# Patient Record
Sex: Female | Born: 1960 | Race: Black or African American | Hispanic: No | Marital: Married | Smoking: Never smoker
Health system: Southern US, Community
[De-identification: ages and names within clinical notes are randomized; demographics above are authoritative.]

## PROBLEM LIST (undated history)

## (undated) DIAGNOSIS — Z803 Family history of malignant neoplasm of breast: Principal | ICD-10-CM

## (undated) DIAGNOSIS — K219 Gastro-esophageal reflux disease without esophagitis: Secondary | ICD-10-CM

## (undated) DIAGNOSIS — E669 Obesity, unspecified: Secondary | ICD-10-CM

## (undated) DIAGNOSIS — Z800 Family history of malignant neoplasm of digestive organs: Secondary | ICD-10-CM

## (undated) DIAGNOSIS — D649 Anemia, unspecified: Secondary | ICD-10-CM

## (undated) DIAGNOSIS — M1990 Unspecified osteoarthritis, unspecified site: Secondary | ICD-10-CM

## (undated) DIAGNOSIS — Z9989 Dependence on other enabling machines and devices: Secondary | ICD-10-CM

## (undated) DIAGNOSIS — C50411 Malignant neoplasm of upper-outer quadrant of right female breast: Secondary | ICD-10-CM

## (undated) DIAGNOSIS — Z171 Estrogen receptor negative status [ER-]: Secondary | ICD-10-CM

## (undated) DIAGNOSIS — I10 Essential (primary) hypertension: Secondary | ICD-10-CM

## (undated) DIAGNOSIS — R011 Cardiac murmur, unspecified: Secondary | ICD-10-CM

## (undated) DIAGNOSIS — Z923 Personal history of irradiation: Secondary | ICD-10-CM

## (undated) DIAGNOSIS — G4733 Obstructive sleep apnea (adult) (pediatric): Secondary | ICD-10-CM

## (undated) HISTORY — DX: Estrogen receptor negative status (ER-): Z17.1

## (undated) HISTORY — DX: Family history of malignant neoplasm of digestive organs: Z80.0

## (undated) HISTORY — PX: BUNIONECTOMY: SHX129

## (undated) HISTORY — PX: LAPAROSCOPIC CHOLECYSTECTOMY: SUR755

## (undated) HISTORY — DX: Malignant neoplasm of upper-outer quadrant of right female breast: C50.411

## (undated) HISTORY — PX: BREAST BIOPSY: SHX20

## (undated) HISTORY — PX: TUBAL LIGATION: SHX77

## (undated) HISTORY — PX: ABDOMINAL HYSTERECTOMY: SHX81

## (undated) HISTORY — DX: Family history of malignant neoplasm of breast: Z80.3

---

## 1981-01-31 HISTORY — PX: DILATION AND CURETTAGE OF UTERUS: SHX78

## 2011-11-26 ENCOUNTER — Emergency Department (HOSPITAL_BASED_OUTPATIENT_CLINIC_OR_DEPARTMENT_OTHER): Payer: BC Managed Care – PPO

## 2011-11-26 ENCOUNTER — Emergency Department (HOSPITAL_BASED_OUTPATIENT_CLINIC_OR_DEPARTMENT_OTHER)
Admission: EM | Admit: 2011-11-26 | Discharge: 2011-11-26 | Disposition: A | Discharge status: Home / Self Care | Payer: BC Managed Care – PPO | Attending: Emergency Medicine | Admitting: Emergency Medicine

## 2011-11-26 ENCOUNTER — Encounter (HOSPITAL_BASED_OUTPATIENT_CLINIC_OR_DEPARTMENT_OTHER): Payer: Self-pay | Admitting: Student

## 2011-11-26 DIAGNOSIS — Z8739 Personal history of other diseases of the musculoskeletal system and connective tissue: Secondary | ICD-10-CM | POA: Insufficient documentation

## 2011-11-26 DIAGNOSIS — M79609 Pain in unspecified limb: Secondary | ICD-10-CM | POA: Insufficient documentation

## 2011-11-26 DIAGNOSIS — E669 Obesity, unspecified: Secondary | ICD-10-CM | POA: Insufficient documentation

## 2011-11-26 DIAGNOSIS — I10 Essential (primary) hypertension: Secondary | ICD-10-CM | POA: Insufficient documentation

## 2011-11-26 DIAGNOSIS — M79606 Pain in leg, unspecified: Secondary | ICD-10-CM

## 2011-11-26 HISTORY — DX: Obesity, unspecified: E66.9

## 2011-11-26 HISTORY — DX: Essential (primary) hypertension: I10

## 2011-11-26 HISTORY — DX: Unspecified osteoarthritis, unspecified site: M19.90

## 2011-11-26 MED ORDER — HYDROCODONE-ACETAMINOPHEN 5-325 MG PO TABS: 1.0000 | ORAL_TABLET | ORAL | Status: DC | PRN

## 2011-11-26 MED ORDER — IBUPROFEN 800 MG PO TABS: 800.0000 mg | ORAL_TABLET | Freq: Three times a day (TID) | ORAL | Status: DC

## 2011-11-26 NOTE — ED Notes (Signed)
Pt just returned from doppler, ambulatory

## 2011-11-26 NOTE — ED Provider Notes (Signed)
History     CSN: [id number]  Arrival date & time 11/26/11  1801   First MD Initiated Contact with Patient 11/26/11 1915      Chief Complaint  Patient presents with  . Leg Pain    right leg    (Consider location/radiation/quality/duration/timing/severity/associated sxs/prior treatment) Patient is a 51 y.o. female presenting with leg pain. The history is provided by the patient.  Leg Pain  The incident occurred more than 1 week ago. There was no injury mechanism. The pain is present in the right leg. The pain is moderate. Pertinent negatives include no numbness. Associated symptoms comments: Right lower extremity pain for 2-3 weeks that is present with walking better with rest. Pain is predominantly in posterior leg, including calf. She does not know whether or not there has been swelling. No discoloration. No fever or known injury. No back pain. She denies SOB or chest pain. .    Past Medical History  Diagnosis Date  . Arthritis   . Hypertension   . Obesity     Past Surgical History  Procedure Date  . Abdominal hysterectomy   . Cholecystectomy     History reviewed. No pertinent family history.  History  Substance Use Topics  . Smoking status: Never Smoker   . Smokeless tobacco: Not on file  . Alcohol Use: No    OB History    Grav Para Term Preterm Abortions TAB SAB Ect Mult Living                  Review of Systems  Respiratory: Negative for shortness of breath.   Cardiovascular: Negative for chest pain.  Musculoskeletal:       See HPI.  Neurological: Negative for weakness and numbness.    Allergies  Review of patient's allergies indicates no known allergies.  Home Medications  No current outpatient prescriptions on file.  BP 193/94  Pulse 81  Temp 98.2 F (36.8 C) (Oral)  Resp 22  Wt 370 lb (167.831 kg)  SpO2 100%  Physical Exam  Constitutional: She is oriented to person, place, and time. She appears well-developed and well-nourished. No  distress.  Cardiovascular:       Distal pulses in LE's present and equal.  Pulmonary/Chest: Effort normal.  Musculoskeletal:       Right leg without swelling or discoloration. No masses. Tender to palpation posterior thigh and calf. FROM lower extremities. Ambulatory with full weight bearing ability.  Neurological: She is alert and oriented to person, place, and time.  Skin: Skin is warm and dry. No erythema.    ED Course  Procedures (including critical care time)  Labs Reviewed - No data to display No results found. US Venous Img Lower Unilateral Right  11/26/2011  *RADIOLOGY REPORT*  Clinical Data: Leg pain  RIGHT LOWER EXTREMITY VENOUS DUPLEX ULTRASOUND  Technique:  Gray-scale sonography with graded compression, as well as color Doppler and duplex ultrasound, were performed to evaluate the deep venous system of the lower extremity from the level of the common femoral vein through the popliteal and proximal calf veins. Spectral Doppler was utilized to evaluate flow at rest and with distal augmentation maneuvers.  Comparison:  None.  Findings: The visualized right lower extremity deep venous system appears patent, noting that the mid/distal SFV is poorly visualized due to body habitus.  Normal compressibility.  Patent color Doppler flow.  Satisfactory spectral Doppler with respiratory variation and response to augmentation.  The greater saphenous vein, where visualized, is patent and compressible.  IMPRESSION: No deep venous thrombosis in the visualized right lower extremity.   Original Report Authenticated By: Charline Bills, M.D.     No diagnosis found. 1. Lower extremity pain    MDM  Doppler study negative for DVT. DDx: muscular leg pain vs. Radiculopathy. Pain medication and PCP follow up.        Rodena Medin, PA-C 11/26/11 2237

## 2011-11-26 NOTE — ED Provider Notes (Signed)
Medical screening examination/treatment/procedure(s) were performed by non-physician practitioner and as supervising physician I was immediately available for consultation/collaboration.   Delton Stelle, MD 11/26/11 2304 

## 2011-11-26 NOTE — ED Notes (Signed)
Right leg pain and tingling in calf and upper leg, reports cramping with exercise.

## 2012-07-17 ENCOUNTER — Institutional Professional Consult (permissible substitution): Payer: BC Managed Care – PPO | Admitting: Pulmonary Disease

## 2013-05-03 ENCOUNTER — Emergency Department (HOSPITAL_BASED_OUTPATIENT_CLINIC_OR_DEPARTMENT_OTHER)
Admission: EM | Admit: 2013-05-03 | Discharge: 2013-05-03 | Disposition: A | Discharge status: Home / Self Care | Payer: BC Managed Care – PPO | Attending: Emergency Medicine | Admitting: Emergency Medicine

## 2013-05-03 ENCOUNTER — Emergency Department (HOSPITAL_BASED_OUTPATIENT_CLINIC_OR_DEPARTMENT_OTHER): Payer: BC Managed Care – PPO

## 2013-05-03 ENCOUNTER — Encounter (HOSPITAL_BASED_OUTPATIENT_CLINIC_OR_DEPARTMENT_OTHER): Payer: Self-pay | Admitting: Emergency Medicine

## 2013-05-03 DIAGNOSIS — I10 Essential (primary) hypertension: Secondary | ICD-10-CM | POA: Insufficient documentation

## 2013-05-03 DIAGNOSIS — IMO0001 Reserved for inherently not codable concepts without codable children: Secondary | ICD-10-CM | POA: Insufficient documentation

## 2013-05-03 DIAGNOSIS — M129 Arthropathy, unspecified: Secondary | ICD-10-CM | POA: Insufficient documentation

## 2013-05-03 DIAGNOSIS — Z791 Long term (current) use of non-steroidal anti-inflammatories (NSAID): Secondary | ICD-10-CM | POA: Insufficient documentation

## 2013-05-03 DIAGNOSIS — M25511 Pain in right shoulder: Secondary | ICD-10-CM

## 2013-05-03 DIAGNOSIS — M25519 Pain in unspecified shoulder: Secondary | ICD-10-CM | POA: Insufficient documentation

## 2013-05-03 DIAGNOSIS — E669 Obesity, unspecified: Secondary | ICD-10-CM | POA: Insufficient documentation

## 2013-05-03 DIAGNOSIS — Z79899 Other long term (current) drug therapy: Secondary | ICD-10-CM | POA: Insufficient documentation

## 2013-05-03 MED ORDER — HYDROCODONE-ACETAMINOPHEN 5-325 MG PO TABS: 1.0000 | ORAL_TABLET | Freq: Four times a day (QID) | ORAL | Status: DC | PRN

## 2013-05-03 NOTE — ED Notes (Signed)
Arm sling applied, pt tolerated well, +PMS post sling application.

## 2013-05-03 NOTE — ED Provider Notes (Signed)
CSN: [id number]     Arrival date & time 05/03/13  1920 History  This chart was scribed for Mervin Kung, MD by Maree Erie, ED Scribe. The patient was seen in room MH10/MH10. Patient's care was started at 9:25 PM.     Chief Complaint  Patient presents with  . Arm Pain     Patient is a 53 y.o. female presenting with arm pain. The history is provided by the patient. No language interpreter was used.  Arm Pain The current episode started more than 2 days ago. The problem occurs daily. The problem has not changed since onset.Pertinent negatives include no chest pain, no abdominal pain, no headaches and no shortness of breath.    HPI Comments: Jodi Mccormick is a 53 y.o. female who presents to the Emergency Department complaining of intermittent upper right arm pain that began four days ago. She describes the pain as throbbing and sharp and rates it as a 10/10 in severity currently. The pain is worsened by going across the chest and raising the arm straight up. She has been taking 800 mg ibuprofen and Etodolac without relief. She denies any injury and states she woke up noticing the pain. She denies decreased ROM secondary to pain. She denies prior similar pain to the same area. She denies neck pain, back pain, fever, chills, visual changes, cough, rhinorrhea, sore throat, chest pain, SOB, abdominal pain, nausea, vomiting, diarrhea, dysuria, leg swelling, rash, headache, numbness or weakness in the extremity or history of bleeding easily.    Pt denies having a PCP currently    Past Medical History  Diagnosis Date  . Arthritis   . Hypertension   . Obesity    Past Surgical History  Procedure Laterality Date  . Abdominal hysterectomy    . Cholecystectomy    . Bunionectomy     No family history on file. History  Substance Use Topics  . Smoking status: Never Smoker   . Smokeless tobacco: Not on file  . Alcohol Use: No   OB History   Grav Para Term Preterm Abortions TAB SAB  Ect Mult Living                 Review of Systems  Constitutional: Negative for fever and chills.  HENT: Negative for rhinorrhea.   Eyes: Negative for visual disturbance.  Respiratory: Negative for cough and shortness of breath.   Cardiovascular: Negative for chest pain and leg swelling.  Gastrointestinal: Negative for nausea, vomiting, abdominal pain and diarrhea.  Genitourinary: Negative for dysuria.  Musculoskeletal: Positive for myalgias. Negative for back pain and neck pain.  Skin: Negative for rash.  Neurological: Negative for headaches.  Hematological: Does not bruise/bleed easily.  Psychiatric/Behavioral: Negative for confusion.      Allergies  Review of patient's allergies indicates no known allergies.  Home Medications   Current Outpatient Rx  Name  Route  Sig  Dispense  Refill  . AMLODIPINE BESYLATE PO   Oral   Take by mouth.         . ETODOLAC PO   Oral   Take by mouth.         Marland Kitchen LOSARTAN POTASSIUM PO   Oral   Take by mouth.         Marland Kitchen HYDROcodone-acetaminophen (NORCO/VICODIN) 5-325 MG per tablet   Oral   Take 1 tablet by mouth every 4 (four) hours as needed for pain.   15 tablet   0   . HYDROcodone-acetaminophen (NORCO/VICODIN) 5-325 MG  per tablet   Oral   Take 1-2 tablets by mouth every 6 (six) hours as needed for moderate pain.   20 tablet   0   . ibuprofen (ADVIL,MOTRIN) 800 MG tablet   Oral   Take 1 tablet (800 mg total) by mouth 3 (three) times daily.   21 tablet   0    Triage Vitals: BP 156/73  Pulse 73  Temp(Src) 98.9 F (37.2 C) (Oral)  Resp 18  Ht 5\' 6"  (1.676 m)  Wt 367 lb (166.47 kg)  BMI 59.26 kg/m2  SpO2 97%  Physical Exam  Nursing note and vitals reviewed. Constitutional: She is oriented to person, place, and time. She appears well-developed and well-nourished.  Eyes: EOM are normal.  Cardiovascular: Normal rate and regular rhythm.   Pulses:      Radial pulses are 2+ on the right side.  Pulmonary/Chest: Effort  normal and breath sounds normal. No respiratory distress. She has no wheezes. She has no rales.  Abdominal: Bowel sounds are normal. There is no tenderness.  Musculoskeletal: She exhibits no edema.  Elbow and fingers on right move well.  Neurological: She is alert and oriented to person, place, and time.  Skin: Skin is warm and dry.  Cap refill 1 second.    ED Course  Procedures (including critical care time)  DIAGNOSTIC STUDIES: Oxygen Saturation is 97% on room air, adequate by my interpretation.    COORDINATION OF CARE: 9:40 PM -Will order right shoulder x-ray. Patient verbalizes understanding and agrees with treatment plan.  Medications - No data to display   Dg Shoulder Right  05/03/2013   CLINICAL DATA:  Right upper arm pain.  EXAM: RIGHT SHOULDER - 2+ VIEW  COMPARISON:  None.  FINDINGS: There is no evidence of fracture or dislocation. The right humeral head is seated within the glenoid fossa. The acromioclavicular joint is unremarkable in appearance. No significant soft tissue abnormalities are seen. The visualized portions of the right lung are clear.  IMPRESSION: No evidence of fracture or dislocation.   Electronically Signed   By: Garald Balding M.D.   On: 05/03/2013 22:46     EKG Interpretation None      MDM   Final diagnoses:  Shoulder pain, right    X-rays negative. Clinically suspect this may be an impingement syndrome. Doesn't seem to fit rotator cuff injury. Will refer patient to sports medicine treat with a sling and pain medication. X-rays negative for any bony injury.  I personally performed the services described in this documentation, which was scribed in my presence. The recorded information has been reviewed and is accurate.     Mervin Kung, MD 05/03/13 [phone]

## 2013-05-03 NOTE — ED Notes (Signed)
Patient transported to X-ray via stretcher per tech. 

## 2013-05-03 NOTE — ED Notes (Addendum)
C/o intermittent pain to entire right arm x 2 weeks-denies injury-pain worse when moves to reach

## 2013-05-03 NOTE — Discharge Instructions (Signed)
Wear the shoulder immobilizer sling as much as possible. Call sports medicine for followup referral information provided. Take pain medication as needed. Return for any new or worse symptoms. Suspect that you may have a shoulder impingement syndrome. Does not seem to be consistent with rotator cuff injury.

## 2013-05-03 NOTE — ED Notes (Signed)
MD at bedside. 

## 2013-05-07 ENCOUNTER — Ambulatory Visit (INDEPENDENT_AMBULATORY_CARE_PROVIDER_SITE_OTHER): Payer: BC Managed Care – PPO | Admitting: Family Medicine

## 2013-05-07 ENCOUNTER — Encounter: Payer: Self-pay | Admitting: Family Medicine

## 2013-05-07 VITALS — BP 125/80 | HR 60 | Ht 66.0 in | Wt 365.0 lb

## 2013-05-07 DIAGNOSIS — M25519 Pain in unspecified shoulder: Secondary | ICD-10-CM

## 2013-05-07 DIAGNOSIS — M25511 Pain in right shoulder: Secondary | ICD-10-CM

## 2013-05-07 NOTE — Patient Instructions (Signed)
You have rotator cuff impingement Try to avoid painful activities (overhead activities, lifting with extended arm) as much as possible. Aleve 2 tabs twice a day with food OR ibuprofen 3 tabs three times a day with food for pain and inflammation. Can take tylenol in addition to this. Subacromial injection may be beneficial to help with pain and to decrease inflammation - you were given this today. Consider physical therapy with transition to home exercise program. In about 5 days start the home exercise program with theraband and scapular stabilization exercises daily 3 sets of 10 - these are very important for long term relief even if an injection was given. If not improving at follow-up we will consider further imaging, physical therapy and/or nitro patches. Follow up with me in 1 month to 6 weeks.

## 2013-05-09 ENCOUNTER — Encounter: Payer: Self-pay | Admitting: Family Medicine

## 2013-05-09 DIAGNOSIS — M25511 Pain in right shoulder: Secondary | ICD-10-CM | POA: Insufficient documentation

## 2013-05-09 NOTE — Progress Notes (Signed)
Patient ID: Jodi Mccormick, female   DOB: 1960-04-21, 53 y.o.   MRN: [id number]  PCP: No PCP Per Patient  Subjective:   HPI: Patient is a 53 y.o. female here for right arm pain.  Patient denies known injury or trauma. States about 1 week ago began having pain in right shoulder/upper arm. Pain worse with certain movements - overhead, reaching, reaching behind. Right handed. Has had bursitis of this shoulder before. Ibuprofen helped initially but not now.  Past Medical History  Diagnosis Date  . Arthritis   . Hypertension   . Obesity     Current Outpatient Prescriptions on File Prior to Visit  Medication Sig Dispense Refill  . AMLODIPINE BESYLATE PO Take by mouth.      Marland Kitchen LOSARTAN POTASSIUM PO Take by mouth.      . ETODOLAC PO Take by mouth.      Marland Kitchen HYDROcodone-acetaminophen (NORCO/VICODIN) 5-325 MG per tablet Take 1 tablet by mouth every 4 (four) hours as needed for pain.  15 tablet  0  . HYDROcodone-acetaminophen (NORCO/VICODIN) 5-325 MG per tablet Take 1-2 tablets by mouth every 6 (six) hours as needed for moderate pain.  20 tablet  0  . ibuprofen (ADVIL,MOTRIN) 800 MG tablet Take 1 tablet (800 mg total) by mouth 3 (three) times daily.  21 tablet  0   No current facility-administered medications on file prior to visit.    Past Surgical History  Procedure Laterality Date  . Abdominal hysterectomy    . Cholecystectomy    . Bunionectomy      No Known Allergies  History   Social History  . Marital Status: Married    Spouse Name: N/A    Number of Children: N/A  . Years of Education: N/A   Occupational History  . Not on file.   Social History Main Topics  . Smoking status: Never Smoker   . Smokeless tobacco: Not on file  . Alcohol Use: No  . Drug Use: No  . Sexual Activity: Not on file   Other Topics Concern  . Not on file   Social History Narrative  . No narrative on file    Family History  Problem Relation Age of Onset  . Hypertension Mother   .  Hypertension Father   . Diabetes Father     BP 125/80  Pulse 60  Ht 5\' 6"  (1.676 m)  Wt 365 lb (165.563 kg)  BMI 58.94 kg/m2  Review of Systems: See HPI above.    Objective:  Physical Exam:  Gen: NAD  Right shoulder: No swelling, ecchymoses.  No gross deformity. Mild anterior tenderness. FROM with painful arc. Positive Hawkins, Neers. Negative Speeds, Yergasons. Strength 5/5 with empty can and resisted internal/external rotation. Pain empty can > ER. NV intact distally.    Assessment & Plan:  1. Right shoulder pain - 2/2 rotator cuff impingement.  Discussed options - She would like to start with HEP which was demonstrated today.  Subacromial injection given as well though technically difficult due to habitus.  NSAIDs as needed, avoid painful activities (overhead, reaching).  Consider physical therapy, further imaging, nitro patches if not improving.  After informed written consent, patient was seated on exam table. Right shoulder was prepped with alcohol swab and utilizing posterior approach, patient's right subacromial space was injected with 3:1 marcaine: depomedrol. Patient tolerated the procedure well without immediate complications.

## 2013-05-09 NOTE — Assessment & Plan Note (Signed)
2/2 rotator cuff impingement.  Discussed options - She would like to start with HEP which was demonstrated today.  Subacromial injection given as well though technically difficult due to habitus.  NSAIDs as needed, avoid painful activities (overhead, reaching).  Consider physical therapy, further imaging, nitro patches if not improving.  After informed written consent, patient was seated on exam table. Right shoulder was prepped with alcohol swab and utilizing posterior approach, patient's right subacromial space was injected with 3:1 marcaine: depomedrol. Patient tolerated the procedure well without immediate complications.

## 2013-06-04 ENCOUNTER — Encounter: Payer: Self-pay | Admitting: Family Medicine

## 2013-06-04 ENCOUNTER — Ambulatory Visit (INDEPENDENT_AMBULATORY_CARE_PROVIDER_SITE_OTHER): Payer: BC Managed Care – PPO | Admitting: Family Medicine

## 2013-06-04 VITALS — BP 114/73 | HR 58 | Ht 66.0 in | Wt 367.0 lb

## 2013-06-04 DIAGNOSIS — M25519 Pain in unspecified shoulder: Secondary | ICD-10-CM

## 2013-06-04 DIAGNOSIS — M25511 Pain in right shoulder: Secondary | ICD-10-CM

## 2013-06-04 MED ORDER — NITROGLYCERIN 0.2 MG/HR TD PT24: MEDICATED_PATCH | TRANSDERMAL | Status: DC

## 2013-06-04 NOTE — Patient Instructions (Signed)
You have rotator cuff impingement Start physical therapy and do home exercises on days you don't go to therapy. Add nitro patches - cut in to 1/4ths and put one of these over the affected shoulder, change every day. Try to avoid painful activities (overhead activities, lifting with extended arm) as much as possible. Aleve 2 tabs twice a day with food OR ibuprofen 3 tabs three times a day with food for pain and inflammation. Can take tylenol in addition to this. If not improving at follow-up we will consider MRI. Follow up with me in 6 weeks.

## 2013-06-05 ENCOUNTER — Encounter: Payer: Self-pay | Admitting: Family Medicine

## 2013-06-05 NOTE — Progress Notes (Addendum)
Patient ID: Jodi Mccormick, female   DOB: 1960-11-13, 53 y.o.   MRN: [id number]  PCP: No PCP Per Patient  Subjective:   HPI: Patient is a 53 y.o. female here for right arm pain.  4/7: Patient denies known injury or trauma. States about 1 week ago began having pain in right shoulder/upper arm. Pain worse with certain movements - overhead, reaching, reaching behind. Right handed. Has had bursitis of this shoulder before. Ibuprofen helped initially but not now.  5/5: Patient reports injection helped some but still has 7/10 level of pain - much better with certain movements, doesn't get to 10/10. Lying on this not bad - worse in morning though. Doing HEP some.  Past Medical History  Diagnosis Date  . Arthritis   . Hypertension   . Obesity     Current Outpatient Prescriptions on File Prior to Visit  Medication Sig Dispense Refill  . AMLODIPINE BESYLATE PO Take by mouth.      . ETODOLAC PO Take by mouth.      Marland Kitchen HYDROcodone-acetaminophen (NORCO/VICODIN) 5-325 MG per tablet Take 1 tablet by mouth every 4 (four) hours as needed for pain.  15 tablet  0  . HYDROcodone-acetaminophen (NORCO/VICODIN) 5-325 MG per tablet Take 1-2 tablets by mouth every 6 (six) hours as needed for moderate pain.  20 tablet  0  . ibuprofen (ADVIL,MOTRIN) 800 MG tablet Take 1 tablet (800 mg total) by mouth 3 (three) times daily.  21 tablet  0  . LOSARTAN POTASSIUM PO Take by mouth.       No current facility-administered medications on file prior to visit.    Past Surgical History  Procedure Laterality Date  . Abdominal hysterectomy    . Cholecystectomy    . Bunionectomy      No Known Allergies  History   Social History  . Marital Status: Married    Spouse Name: N/A    Number of Children: N/A  . Years of Education: N/A   Occupational History  . Not on file.   Social History Main Topics  . Smoking status: Never Smoker   . Smokeless tobacco: Not on file  . Alcohol Use: No  . Drug Use: No   . Sexual Activity: Not on file   Other Topics Concern  . Not on file   Social History Narrative  . No narrative on file    Family History  Problem Relation Age of Onset  . Hypertension Mother   . Hypertension Father   . Diabetes Father     BP 114/73  Pulse 58  Ht 5\' 6"  (1.676 m)  Wt 367 lb (166.47 kg)  BMI 59.26 kg/m2  Review of Systems: See HPI above.    Objective:  Physical Exam:  Gen: NAD  Right shoulder: No swelling, ecchymoses.  No gross deformity. Mild anterior tenderness. FROM with painful arc. Positive Hawkins, Neers. Negative Speeds, Yergasons. Strength 5/5 with empty can and resisted internal/external rotation. Pain empty can > ER. NV intact distally.    Assessment & Plan:  1. Right shoulder pain - 2/2 rotator cuff impingement.  Injection helped her a little symptomatically but not significantly.  Will start physical therapy and nitro patches.  F/u in 6 weeks.  Consider MRI if not improving.  Addendum:  MRI reviewed and discussed with patient.  She has evidence of tendinopathy, bursitis as well as a very small insertional supraspinatus tear.  She would like to continue with current treatment instead of seeing a surgeon at this  time.  Will continue nitro patches out to 3 months.  Continue HEP.  Call to follow up at that time or if she would like to see orthopedic surgeon.

## 2013-06-05 NOTE — Assessment & Plan Note (Signed)
2/2 rotator cuff impingement.  Injection helped her a little symptomatically but not significantly.  Will start physical therapy and nitro patches.  F/u in 6 weeks.  Consider MRI if not improving.

## 2013-06-11 ENCOUNTER — Ambulatory Visit: Payer: BC Managed Care – PPO | Admitting: Family Medicine

## 2013-06-11 ENCOUNTER — Ambulatory Visit: Payer: BC Managed Care – PPO | Admitting: Physical Therapy

## 2013-07-01 ENCOUNTER — Telehealth: Payer: Self-pay | Admitting: Family Medicine

## 2013-07-01 DIAGNOSIS — M25511 Pain in right shoulder: Secondary | ICD-10-CM

## 2013-07-01 NOTE — Telephone Encounter (Signed)
Patient reports she is unfortunately not improving despite injection, physical therapy, home exercises, and nitro patches.  We will move forward with MRI to assess for right rotator cuff tear.

## 2013-07-01 NOTE — Telephone Encounter (Signed)
See other phone note

## 2013-07-13 ENCOUNTER — Ambulatory Visit (HOSPITAL_BASED_OUTPATIENT_CLINIC_OR_DEPARTMENT_OTHER)
Admission: RE | Admit: 2013-07-13 | Discharge: 2013-07-13 | Disposition: A | Discharge status: Home / Self Care | Payer: BC Managed Care – PPO | Source: Ambulatory Visit | Attending: Family Medicine | Admitting: Family Medicine

## 2013-07-13 DIAGNOSIS — M25519 Pain in unspecified shoulder: Secondary | ICD-10-CM | POA: Insufficient documentation

## 2013-07-13 DIAGNOSIS — M719 Bursopathy, unspecified: Secondary | ICD-10-CM | POA: Insufficient documentation

## 2013-07-13 DIAGNOSIS — M67919 Unspecified disorder of synovium and tendon, unspecified shoulder: Secondary | ICD-10-CM | POA: Insufficient documentation

## 2013-07-13 DIAGNOSIS — M25511 Pain in right shoulder: Secondary | ICD-10-CM

## 2013-07-16 ENCOUNTER — Ambulatory Visit: Payer: BC Managed Care – PPO | Admitting: Family Medicine

## 2015-05-06 ENCOUNTER — Other Ambulatory Visit: Payer: Self-pay | Admitting: Sports Medicine

## 2015-05-06 DIAGNOSIS — M545 Low back pain: Secondary | ICD-10-CM

## 2015-05-14 ENCOUNTER — Ambulatory Visit
Admission: RE | Admit: 2015-05-14 | Discharge: 2015-05-14 | Disposition: A | Discharge status: Home / Self Care | Payer: BLUE CROSS/BLUE SHIELD | Source: Ambulatory Visit | Attending: Sports Medicine | Admitting: Sports Medicine

## 2015-05-14 DIAGNOSIS — M545 Low back pain: Secondary | ICD-10-CM

## 2016-02-01 HISTORY — PX: BREAST LUMPECTOMY: SHX2

## 2016-06-30 ENCOUNTER — Other Ambulatory Visit: Payer: Self-pay | Admitting: Internal Medicine

## 2016-06-30 DIAGNOSIS — R921 Mammographic calcification found on diagnostic imaging of breast: Secondary | ICD-10-CM

## 2016-07-06 ENCOUNTER — Ambulatory Visit
Admission: RE | Admit: 2016-07-06 | Discharge: 2016-07-06 | Disposition: A | Discharge status: Home / Self Care | Payer: BLUE CROSS/BLUE SHIELD | Source: Ambulatory Visit | Attending: Internal Medicine | Admitting: Internal Medicine

## 2016-07-06 DIAGNOSIS — R921 Mammographic calcification found on diagnostic imaging of breast: Secondary | ICD-10-CM

## 2016-07-12 ENCOUNTER — Telehealth: Payer: Self-pay | Admitting: *Deleted

## 2016-07-12 NOTE — Telephone Encounter (Signed)
Confirmed [facility] for 07/20/16 at 8:15am .  Instructions and contact information given.

## 2016-07-19 ENCOUNTER — Other Ambulatory Visit: Payer: Self-pay | Admitting: *Deleted

## 2016-07-19 ENCOUNTER — Encounter: Payer: Self-pay | Admitting: Genetics

## 2016-07-19 DIAGNOSIS — C50411 Malignant neoplasm of upper-outer quadrant of right female breast: Secondary | ICD-10-CM

## 2016-07-19 DIAGNOSIS — Z171 Estrogen receptor negative status [ER-]: Principal | ICD-10-CM

## 2016-07-20 ENCOUNTER — Other Ambulatory Visit (HOSPITAL_BASED_OUTPATIENT_CLINIC_OR_DEPARTMENT_OTHER): Payer: BLUE CROSS/BLUE SHIELD

## 2016-07-20 ENCOUNTER — Other Ambulatory Visit: Payer: BLUE CROSS/BLUE SHIELD

## 2016-07-20 ENCOUNTER — Ambulatory Visit
Admission: RE | Admit: 2016-07-20 | Discharge: 2016-07-20 | Disposition: A | Discharge status: Home / Self Care | Payer: BLUE CROSS/BLUE SHIELD | Source: Ambulatory Visit | Attending: Radiation Oncology | Admitting: Radiation Oncology

## 2016-07-20 ENCOUNTER — Ambulatory Visit (HOSPITAL_BASED_OUTPATIENT_CLINIC_OR_DEPARTMENT_OTHER): Payer: BLUE CROSS/BLUE SHIELD | Admitting: Genetics

## 2016-07-20 ENCOUNTER — Ambulatory Visit (HOSPITAL_BASED_OUTPATIENT_CLINIC_OR_DEPARTMENT_OTHER): Payer: BLUE CROSS/BLUE SHIELD | Admitting: Oncology

## 2016-07-20 ENCOUNTER — Encounter: Payer: Self-pay | Admitting: Genetic Counselor

## 2016-07-20 ENCOUNTER — Ambulatory Visit: Payer: BLUE CROSS/BLUE SHIELD | Attending: General Surgery | Admitting: Physical Therapy

## 2016-07-20 ENCOUNTER — Encounter: Payer: Self-pay | Admitting: Oncology

## 2016-07-20 ENCOUNTER — Encounter: Payer: Self-pay | Admitting: Physical Therapy

## 2016-07-20 VITALS — BP 141/61 | HR 72 | Temp 98.3°F | Resp 18 | Ht 66.0 in | Wt 368.4 lb

## 2016-07-20 DIAGNOSIS — G8929 Other chronic pain: Secondary | ICD-10-CM

## 2016-07-20 DIAGNOSIS — Z800 Family history of malignant neoplasm of digestive organs: Secondary | ICD-10-CM | POA: Diagnosis not present

## 2016-07-20 DIAGNOSIS — M545 Low back pain: Secondary | ICD-10-CM | POA: Diagnosis not present

## 2016-07-20 DIAGNOSIS — Z171 Estrogen receptor negative status [ER-]: Secondary | ICD-10-CM | POA: Diagnosis not present

## 2016-07-20 DIAGNOSIS — M549 Dorsalgia, unspecified: Secondary | ICD-10-CM | POA: Insufficient documentation

## 2016-07-20 DIAGNOSIS — Z6841 Body Mass Index (BMI) 40.0 and over, adult: Secondary | ICD-10-CM

## 2016-07-20 DIAGNOSIS — C50411 Malignant neoplasm of upper-outer quadrant of right female breast: Secondary | ICD-10-CM

## 2016-07-20 DIAGNOSIS — Z315 Encounter for genetic counseling: Secondary | ICD-10-CM | POA: Diagnosis not present

## 2016-07-20 DIAGNOSIS — Z803 Family history of malignant neoplasm of breast: Secondary | ICD-10-CM | POA: Diagnosis not present

## 2016-07-20 DIAGNOSIS — R293 Abnormal posture: Secondary | ICD-10-CM | POA: Diagnosis present

## 2016-07-20 HISTORY — DX: Family history of malignant neoplasm of breast: Z80.3

## 2016-07-20 HISTORY — DX: Family history of malignant neoplasm of digestive organs: Z80.0

## 2016-07-20 LAB — CBC WITH DIFFERENTIAL/PLATELET
BASO%: 0.5 % (ref 0.0–2.0)
Basophils Absolute: 0 10*3/uL (ref 0.0–0.1)
EOS%: 1 % (ref 0.0–7.0)
Eosinophils Absolute: 0.1 10*3/uL (ref 0.0–0.5)
HCT: 37.7 % (ref 34.8–46.6)
HGB: 11.9 g/dL (ref 11.6–15.9)
LYMPH%: 26.8 % (ref 14.0–49.7)
MCH: 22.6 pg — ABNORMAL LOW (ref 25.1–34.0)
MCHC: 31.6 g/dL (ref 31.5–36.0)
MCV: 71.6 fL — ABNORMAL LOW (ref 79.5–101.0)
MONO#: 0.5 10*3/uL (ref 0.1–0.9)
MONO%: 5.7 % (ref 0.0–14.0)
NEUT#: 5.4 10*3/uL (ref 1.5–6.5)
NEUT%: 66 % (ref 38.4–76.8)
Platelets: 380 10*3/uL (ref 145–400)
RBC: 5.27 10*6/uL (ref 3.70–5.45)
RDW: 17.2 % — ABNORMAL HIGH (ref 11.2–14.5)
WBC: 8.2 10*3/uL (ref 3.9–10.3)
lymph#: 2.2 10*3/uL (ref 0.9–3.3)

## 2016-07-20 LAB — COMPREHENSIVE METABOLIC PANEL
ALT: 14 U/L (ref 0–55)
AST: 11 U/L (ref 5–34)
Albumin: 3.5 g/dL (ref 3.5–5.0)
Alkaline Phosphatase: 87 U/L (ref 40–150)
Anion Gap: 9 mEq/L (ref 3–11)
BUN: 19.2 mg/dL (ref 7.0–26.0)
CO2: 30 mEq/L — ABNORMAL HIGH (ref 22–29)
Calcium: 10 mg/dL (ref 8.4–10.4)
Chloride: 101 mEq/L (ref 98–109)
Creatinine: 0.8 mg/dL (ref 0.6–1.1)
EGFR: >90 mL/min/{1.73_m2} (ref >90)
Glucose: 101 mg/dl (ref 70–140)
Potassium: 3.8 mEq/L (ref 3.5–5.1)
Sodium: 140 mEq/L (ref 136–145)
Total Bilirubin: 0.37 mg/dL (ref 0.20–1.20)
Total Protein: 7.4 g/dL (ref 6.4–8.3)

## 2016-07-20 NOTE — Patient Instructions (Signed)

## 2016-07-20 NOTE — Progress Notes (Signed)
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   The encounter diagnosis was Malignant neoplasm of upper-outer quadrant of right breast in female, estrogen receptor negative ([place]).  CHIEF COMPLAINT: Here to discuss management of right breast cancer  HISTORY OF PRESENT ILLNESS::Jodi Mccormick is a 56 y.o. female who presented for screening mammogram on 06/15/2016 and was found to have calcifications in the right breast. Diagnostic mammogram on 06/29/2016 revealed a new 1.6 cm group of linear oriented calcifications varying in shape, size, and density within the upper-outer posterior right breast.   Biopsy of the upper outer right breast was performed on 07/06/2016. This revealed a small focus of invasive ductal carcinoma, grade 2-3 as well as DCIS with necrosis, grade 3. ER negative / PR negative. Insufficient tumor present for HER-2 evaluation. Immunostains for p63, SMM-1, and calponin shows negative for myoepithelial cells.  The patient is seen today in our multidisciplinary breast clinic.  Family history of cancer includes four paternal aunts with breast cancer and father with pancreatic cancer.  PREVIOUS RADIATION THERAPY: No  PAST MEDICAL HISTORY:  has a past medical history of Arthritis; Hypertension; and Obesity.    PAST SURGICAL HISTORY: Past Surgical History:  Procedure Laterality Date  . ABDOMINAL HYSTERECTOMY    . BUNIONECTOMY    . CHOLECYSTECTOMY    . TUBAL LIGATION      FAMILY HISTORY: family history includes Breast cancer in her paternal aunt; Diabetes in her father; Hypertension in her father and mother;  Pancreatic cancer in her father.  SOCIAL HISTORY:  reports that she has never smoked. She does not have any smokeless tobacco history on file. She reports that she does not drink alcohol or use drugs.  ALLERGIES: Patient has no known allergies.  MEDICATIONS:  Current Outpatient Prescriptions  Medication Sig Dispense Refill  . AMLODIPINE BESYLATE PO Take 5 mg by mouth daily.     . celecoxib (CELEBREX) 200 MG capsule Take 200 mg by mouth 2 (two) times daily.    Marland Kitchen ibuprofen (ADVIL,MOTRIN) 800 MG tablet Take 1 tablet (800 mg total) by mouth 3 (three) times daily. 21 tablet 0  . losartan-hydrochlorothiazide (HYZAAR) 100-25 MG tablet Take 1 tablet by mouth daily.    . meloxicam (MOBIC) 15 MG tablet Take 15 mg by mouth daily.    Marland Kitchen rOPINIRole (REQUIP) 3 MG tablet Take 3 mg by mouth at bedtime.     No current facility-administered medications for this encounter.    Gynecologic History  Age at first menstrual period? 14  Are you still having periods? No Approximate date of last period? July 2010  If you are still having periods: Are your periods regular? No  If you no longer have periods: Have you used hormone replacement? No  If YES, for how long? 8 years When did you stop? Surgery Obstetric History:  How many children have you carried to term? 2 Your age at first live birth? 4  Pregnant now or trying to get pregnant? No  Have you used birth control pills or hormone shots for contraception? Yes  If so, for how long (or approximate dates)? [id number] (pills)  Would you be interested in  learning more about the options to preserve fertility? N/A Health Maintenance:  Have you ever had a colonoscopy? Yes If yes, date? N/A  Have you ever had a bone density? Yes If yes, date? N/A  Date of your last PAP smear? 2010 Date of your FIRST mammogram? 2003   REVIEW OF SYSTEMS:  On review of systems, the patient reports that she is doing well overall. She denies any chest pain, shortness of breath, cough,  fevers, chills, night sweats, unintended weight changes. She denies any bowel or bladder disturbances, and denies abdominal pain, nausea or vomiting. She reports back pain, joint pain, and arthritis. She reports anemia. A complete review of systems is obtained and is otherwise negative.    PHYSICAL EXAM:  Vitals with BMI 07/20/2016  Height _0   Weight 368 lbs 6 oz  BMI 50.9  Systolic 326  Diastolic 61  Pulse 72  Respirations 18   General: Alert and oriented, in no acute distress HEENT: Head is normocephalic. Extraocular movements are intact. Oropharynx is clear. Neck: Neck is supple, no palpable cervical or supraclavicular lymphadenopathy. Heart: Regular in rate and rhythm with no murmurs, rubs, or gallops. Chest: Clear to auscultation bilaterally, with no rhonchi, wheezes, or rales. Abdomen: Soft, nontender, nondistended, with no rigidity or guarding. Extremities: No cyanosis or edema. Lymphatics: see Neck Exam Skin: No concerning lesions. Musculoskeletal: symmetric strength and muscle tone throughout. Neurologic: Cranial nerves II through XII are grossly intact. No obvious focalities. Speech is fluent. Coordination is intact. Psychiatric: Judgment and insight are intact. Affect is appropriate. Breast: Right breast extremely large and pendulous without palpable mass or nipple discharge. Small biopsy site located in the UOQ. Left breast extremely large and pendulous without mass or nipple discharge.    ECOG = 1  0 - Asymptomatic (Fully active, able to carry on all predisease activities without restriction)  1 - Symptomatic but completely ambulatory (Restricted in physically strenuous activity but ambulatory and able to carry out work of a light or sedentary nature. For example, light housework, office work)  2 - Symptomatic, <50% in bed during the day (Ambulatory and capable of all self care but unable to carry out any work activities. Up and about more than 50% of waking  hours)  3 - Symptomatic, >50% in bed, but not bedbound (Capable of only limited self-care, confined to bed or chair 50% or more of waking hours)  4 - Bedbound (Completely disabled. Cannot carry on any self-care. Totally confined to bed or chair)  5 - Death   Eustace Pen MM, Creech RH, Tormey DC, et al. [phone]). "Toxicity and response criteria of the [facility]". [place]a Oncol. 5 (6): 649-55  LABORATORY DATA:  Lab Results  Component Value Date   WBC 8.2 07/20/2016   HGB 11.9 07/20/2016   HCT 37.7 07/20/2016   MCV 71.6 (L) 07/20/2016   PLT 380 07/20/2016   NEUTROABS 5.4 07/20/2016   Lab Results  Component Value Date   NA 140 07/20/2016   K 3.8 07/20/2016   CO2 30 (H) 07/20/2016   GLUCOSE 101 07/20/2016   CREATININE 0.8 07/20/2016   CALCIUM 10.0 07/20/2016      RADIOGRAPHY: Mm Clip Placement Right  Result Date: 07/06/2016 CLINICAL DATA:  Evaluate clip placement following stereotactic guided right breast biopsy. EXAM: DIAGNOSTIC RIGHT MAMMOGRAM POST STEREOTACTIC BIOPSY COMPARISON:  Previous exam(s). FINDINGS: Mammographic images were obtained following stereotactic guided biopsy of calcifications within the upper-outer right breast. The coil shaped clip has migrated  and lies 3.5 cm inferior to the remaining calcifications. IMPRESSION: Migrated coil shaped clip which lies 3.5 cm inferior to remaining calcifications. Final Assessment: Post Procedure Mammograms for Marker Placement Electronically Signed   By: Margarette Canada M.D.   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 This was found to be concordant by Dr. Hassan Rowan. Pathology results were discussed with the patient by telephone. The patient reported doing well after  the biopsy. Post biopsy instructions and care were reviewed and questions were answered. The patient was encouraged to call [facility] for any additional concerns. The patient was [facility] to [facility]. Pathology results reported by Susa Raring RN, BSN on 07/08/2016. Electronically Signed   By: Margarette Canada M.D.   On: 07/08/2016 11:48   Result Date: 07/08/2016 CLINICAL DATA:  56 year old female for tissue sampling of calcifications within the upper-outer right breast. EXAM: RIGHT BREAST STEREOTACTIC CORE NEEDLE BIOPSY COMPARISON:  Previous exams. FINDINGS: The patient and I discussed the procedure of stereotactic-guided biopsy including benefits and alternatives. We discussed the high likelihood of a successful procedure. We discussed the risks of the procedure including infection, bleeding, tissue injury, clip migration, and inadequate sampling. Informed written consent was given. The usual time out protocol was performed immediately prior to the procedure. Using sterile technique and 1% Lidocaine as local anesthetic, under stereotactic guidance, a 9 gauge vacuum assisted device was used to perform core needle biopsy of calcifications in the upper-outer quadrant of the right breast using a superior approach. Specimen radiograph was performed showing calcifications. Specimens with calcifications are identified for pathology. Lesion quadrant: Upper outer quadrant At the conclusion of the procedure, a coil shaped tissue marker clip was deployed into the biopsy cavity. Follow-up 2-view mammogram was performed and dictated separately. IMPRESSION: Stereotactic-guided biopsy of calcifications within the upper-outer right breast. No apparent complications. Electronically Signed: By: Margarette Canada M.D. 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  I discussed the options for treatment with the patient including general options with radiation and breast conservation therapy versus mastectomy. Patient is a candidate for genetics and this may factor in to her overall decision concerning breast conservation therapy. Patient does have chronic back pain which is likely related to her large breasts. We discussed the option for breast reduction surgery at the time of her breast conserving surgery. If the patient proceeds with radiation without breast reduction surgery then we may consider prone position for treatment. She may be a candidate for partial breast radiation therapy with the Mammosite system.   PLAN: Genetics evaluation with final surgical details are pending at this time.  Plastic surgery consultation.     ------------------------------------------------  Blair Promise, PhD, MD  This document serves as a record of services personally performed by Gery Pray, MD. It was created on his behalf by Arlyce Harman, a trained medical scribe. The creation of this record is based on the scribe's personal observations and the provider's statements to them. This document  has been checked and approved by the attending provider.

## 2016-07-20 NOTE — Progress Notes (Addendum)
REFERRING PROVIDER: Chauncey Cruel, MD [address] [place],  [address]  PRIMARY PROVIDER:  Loraine Leriche., MD  PRIMARY REASON FOR VISIT:  1. Family history of breast cancer 2.family history of pancreatic cancer 3. Malignant neoplasm of upper-outer quadrant of right breast in female, estrogen receptor negative ([place])   HISTORY OF PRESENT ILLNESS:   Jodi Mccormick, a 56 y.o. female, was seen for a [place] cancer genetics consultation at the request of Dr. Jana Hakim due to a personal history of cancer as well as a family history of breast and pancreatic cancer.  Jodi Mccormick presents to clinic today to discuss the possibility of a hereditary predisposition to cancer, genetic testing, and to further clarify her future cancer risks, as well as potential cancer risks for family members. She was accompanied by her friend, Stanton Kidney, at her visit today.   In June 2018, at the age of 48, Jodi Mccormick was diagnosed with ER-/PR- invasive and In Situ Ductal Carcinoma  of the right  breast.    CANCER HISTORY:  Personal history of Breast Cancer at the age of 75.    HORMONAL RISK FACTORS:  Menarche was at age 58.  First live birth at age 51.  OCP use for approximately 4 years.  Ovaries intact: no. ` Hysterectomy: yes.  Menopausal status: postmenopausal.  HRT use: 0 years. Colonoscopy: yes; a few benign polyps identified.  Started colonoscopies at 76. Mammogram within the last year: yes. Number of breast biopsies: 1.   Past Medical History:  Diagnosis Date  . Arthritis   . Estrogen receptor negative status (ER-)   . Family history of breast cancer 07/20/2016  . Family history of pancreatic cancer 07/20/2016  . Hypertension   . Malignant neoplasm of upper-outer quadrant of right female breast ([place])   . Obesity     Past Surgical History:  Procedure Laterality Date  . ABDOMINAL HYSTERECTOMY    . BUNIONECTOMY    . CHOLECYSTECTOMY    . TUBAL LIGATION       Social History   Social History  . Marital status: Married    Spouse name: N/A  . Number of children: N/A  . Years of education: N/A   Social History Main Topics  . Smoking status: Never Smoker  . Smokeless tobacco: None  . Alcohol use No  . Drug use: No  . Sexual activity: Not Asked   Other Topics Concern  . None   Social History Narrative  . None     FAMILY HISTORY:  We obtained a detailed, 4-generation family history.  Significant diagnoses are listed below: Family History  Problem Relation Age of Onset  . Hypertension Mother   . Leukemia Mother 16  . Hypertension Father   . Diabetes Father   . Pancreatic cancer Father 28       died 52  . Breast cancer Paternal Aunt 32       died in 64's  . Kidney disease Maternal Grandmother   . Breast cancer Paternal Aunt 33       died in 70's  . Breast cancer Paternal Aunt 41       died in 76's  . Breast cancer Paternal Aunt 21   The patient has 2 daughters ages 68 and 25 with no history of cancer  The youngest daughter has 2 children, and is currently pregnant .  The oldest daughter (18) has a son and a daughter.  Jodi Mccormick has 4 bothers in their 69's; all  are cancer free.  One brother is 4, with no children.  Two brothers are 38 year-old identical twins.  One twin has a 24 year-old daughter, and the other with has a 53 year old daughter and a 24 year-old son.    The patient's mother is 65 and was diagnosed with Leukemia at the age of 78.  The maternal grandmother died in her 48's due to kidney disease.  This maternal grandmother had 5 brothers, all cancer free.  No information is know about the patient's maternal grandfather.  The patient's father was diagnosed with pancreatic cancer at 49, and died at the age of 45.  There are 4 paternal aunts who were diagnosed with breast caner: -One paternal aunt died in her 32's from breast cancer. Age of breast cancer diagnosis unknown   -This paternal aunt had 1 son who died  in his 23's -One paternal aunt died in her 104's from breast cancer. Age of breast cancer diagnosis unknown  -This paternal aunt has a son in his 21's that is reportedly cancer free.  -One paternal aunt died in her 37's from breast cancer. Age of breast cancer diagnosis unknown  -This paternal aunt had 2 sons.  One son died at the age of 63 from some unknown type of cancer located around his shoulder. -one paternal aunt was diagnosed with breast cancer in her 31's and is now 17.   -This paternal aunt has a son who is deceased and a daughter in her 57's with no known cancer.   The patient has 5 other paternal aunts and 6 paternal uncles.  No known cancer diagnosis for any of these aunts and uncles. The patient's paternal grandfather died in his 39's from a medical condition in which blood was in his urine.  This paternal grandfather had a brother who is deceased, and had no known cancer diagnosis.  The paternal grandmother died in her 54's from an unknown cause.  No information is known about  The paternal grandmother's side of the family.    Jodi Mccormick is unaware of previous family history of genetic testing for hereditary cancer risks. Patient's maternal ancestors are of African [employer], and paternal ancestors are of African American descent. There is No reported Ashkenazi Jewish ancestry. There is No known consanguinity.  GENETIC COUNSELING ASSESSMENT: Jodi Mccormick is a 56 y.o. female with a personal and family history which is somewhat suggestive of a Hereditary cancer syndrome and predisposition to cancer. We, therefore, discussed and recommended the following at today's visit.       DISCUSSION:  We reviewed the characteristics, features and inheritance patterns of hereditary cancer syndromes. We also discussed genetic testing, including the appropriate family members to test, the process of testing, insurance coverage and turn-around-time for results. We discussed the  implications of a negative, positive and/or variant of uncertain significant result.   We discussed that only 5-10% of cancers are associated with a  Hereditary cancer predisposition syndrome.  One of the most common hereditary cancer syndromes that increase breast cancer risk is called Hereditary Breast and Ovarian Cancer (HBOC) syndrome.  This syndrome is caused my mutation in the BRCA1 and BRCA2 genes.  This syndrome increases an individual's lifetime risk to develop breast, ovarian, pancreatic, and other types of cancer.  There are also many other cancer predisposition syndromes caused by mutations in several other genes.  We discussed that if she is found to have a mutation in one of these genes, it may impact surgical  decisions, and alter future medical management recommendations such as increased cancer screenings and consideration of risk reducing surgeries.  A positive result could also have implications for the patient's family members.  A Negative result would mean we were unable to identify a hereditary component to her cancer, but does not rule out the possibility of a hereditary basis for her cancer.  There could be mutations that are undetectable by current technology, or in genes not yet tested or identified to increase cancer risk.  We discussed the potential to find a Variant of Uncertain Significance or VUS.  These are variants that have not yet been identified as pathogenic or benign, and it is unknown if this variant is associated with increased cancer risk or if this is a normal finding.  Most VUS's are reclassified to benign or likely benign.   It should not be used to make medical management decisions. With time, we suspect the lab will determine the significance of any VUS's identified if any.   In order to get genetic test results in a timely manner so that Jodi Mccormick can use these genetic test results for surgical decisions, we recommended Jodi Mccormick pursue genetic testing  for the [facility] Breast Cancer STAT gene panel. The STAT Breast cancer panel offered by [facility] includes sequencing and rearrangement analysis for the following 9 genes:  ATM, BRCA1, BRCA2, CDH1, CHEK2, PALB2, PTEN, STK11 and TP53.  If this test is negative, we then recommend Jodi Mccormick consider reflex genetic testing to the [facility] Common Hereditary Cancers Panel gene panel The Common Hereditary Gene Panel offered by [facility] includes sequencing and/or deletion duplication testing of the following 46 genes: APC, ATM, AXIN2, BARD1, BMPR1A, BRCA1, BRCA2, BRIP1, CDH1, CDKN2A (p14ARF), CDKN2A (p16INK4a), CHEK2, CTNNA1, DICER1, EPCAM (Deletion/duplication testing only), GREM1 (promoter region deletion/duplication testing only), KIT, MEN1, MLH1, MSH2, MSH3, MSH6, MUTYH, NBN, NF1, NHTL1, PALB2, PDGFRA, PMS2, POLD1, POLE, PTEN, RAD50, RAD51C, RAD51D, SDHB, SDHC, SDHD, SMAD4, SMARCA4. STK11, TP53, TSC1, TSC2, and VHL.  The following genes were evaluated for sequence changes only: SDHA and HOXB13 c.251G>A variant only.  Based on Jodi Mccormick's personal and family history of cancer, she meets medical criteria for genetic testing. Despite that she meets criteria, she may still have an out of pocket cost. We discussed that if her out of pocket cost for testing is over $100, the laboratory will call and confirm whether she wants to proceed with testing.  If the out of pocket cost of testing is less than $100 she will be billed by the genetic testing laboratory.    PLAN: After considering the risks, benefits, and limitations, Jodi Mccormick  provided informed consent to pursue genetic testing and the blood sample was sent to [facility] for analysis of the Breast Cancer STAT gene panel. The STAT Breast cancer panel offered by [facility] includes sequencing and rearrangement analysis for the following 9 genes:  ATM, BRCA1, BRCA2, CDH1, CHEK2, PALB2, PTEN, STK11 and TP53.  Results should be available within  approximately 1-2 weeks' time, at which point they will be disclosed by telephone to Jodi Mccormick, as will any additional recommendations warranted by these results. Jodi Mccormick will receive a summary of her genetic counseling visit and a copy of her results once available. This information will also be available in Epic. We encouraged Jodi Mccormick to remain in contact with cancer genetics annually so that we can continuously update the family history and inform her of any changes in cancer genetics and testing that may be  of benefit for her family. Jodi Mccormick questions were answered to her satisfaction today. Our contact information was provided should additional questions or concerns arise.  Lastly, we encouraged Jodi Mccormick to remain in contact with cancer genetics annually so that we can continuously update the family history and inform her of any changes in cancer genetics and testing that may be of benefit for this family.   Ms.  Mccormick questions were answered to her satisfaction today. Our contact information was provided should additional questions or concerns arise. Thank you for the referral and allowing [place] to share in the care of your patient.   Tana Felts, MS Genetic Counselor [email or web address] phone: [phone]  The patient was seen for a total of 45 minutes in face-to-face genetic counseling.  This patient was discussed with Drs. Magrinat who agrees with the above.

## 2016-07-20 NOTE — Progress Notes (Signed)
Jodi Mccormick  [phone] [phone]     ID: Jodi Mccormick DOB: 1960/06/15  MR#: [id number]  LXB#:[id number]  Patient Care Team: Loraine Leriche., MD as PCP - General (Internal Medicine) Excell Seltzer, MD as Consulting Physician (General Surgery) Tramond Slinker, Virgie Dad, MD as Consulting Physician (Oncology) Gery Pray, MD as Consulting Physician (Radiation Oncology) Chauncey Cruel, MD OTHER MD:  CHIEF COMPLAINT: Invasive ductal carcinoma of the right breast  CURRENT TREATMENT: Definitive surgery pending   BREAST CANCER HISTORY: The patient had screening mammography in [facility] suggesting a change in her right breast. She was referred to the [facility] where 07/06/2016 she underwent stereotactic biopsy of the upper-outer quadrant of the right breast, which showed ([id number]) an invasive ductal carcinoma, grade 2 or 3, in the setting of ductal carcinoma in situ, grade 3. There was not enough invasive tumor to do a prognostic panel but the noninvasive breast cancer was estrogen and progesterone receptor negative.  Her subsequent history is as detailed below  INTERVAL HISTORY: Jodi Mccormick was evaluated in the multidisciplinary breast cancer clinic accompanied by her husband Jodi Mccormick. Her case was also presented in the multidisciplinary breast cancer conference that same morning. At that time a preliminary plan was proposed: Breast conserving surgery with sentinel lymph node sampling and consideration of radiation. Systemic therapy would depend on the results of the prognostic panel from the final surgery. Genetics testing was recommended  REVIEW OF SYSTEMS: There were no specific symptoms leading to the original mammogram, which was routinely scheduled. The patient denies unusual headaches, visual changes, nausea, vomiting, stiff neck, dizziness, or gait imbalance. She admits to chronic mild intermittent low back pain There has been no cough,  phlegm production, or pleurisy, no chest pain or pressure, and no change in bowel or bladder habits. The patient denies fever, rash, bleeding, unexplained fatigue or unexplained weight loss. A detailed review of systems was otherwise entirely negative.   PAST MEDICAL HISTORY: Past Medical History:  Diagnosis Date  . Arthritis   . Hypertension   . Obesity     PAST SURGICAL HISTORY: Past Surgical History:  Procedure Laterality Date  . ABDOMINAL HYSTERECTOMY    . BUNIONECTOMY    . CHOLECYSTECTOMY    . TUBAL LIGATION      FAMILY HISTORY Family History  Problem Relation Age of Onset  . Hypertension Mother   . Hypertension Father   . Diabetes Father   . Pancreatic cancer Father   . Breast cancer Paternal 68   The patient's father died at age 33, from pancreatic cancer diagnosed 2 years before. The patient's mother is age 4 as of June 2018. The patient had 4 brothers, no sisters. 4 maternal aunts had breast cancer at various ages. The patient's father died at age  58 HISTORY:  No LMP recorded. Patient has had a hysterectomy. Menarche age 16, first live birth age 43. The patient is GX P2. She stopped having periods in 2010. 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 Her husband Jodi Mccormick is disabled secondary to low back problems. Daughter Jodi Mccormick lives [place] and where she is an Therapist, sports. Daughter Jodi Mccormick also lives in [place] and where she works in a call center. The patient has 10 grandchildren and one on the way. She attends [employer] in [place]: Not in place  HEALTH MAINTENANCE: Social History  Substance Use Topics  . Smoking status: Never Smoker  . Smokeless tobacco: Not on file  . Alcohol use No     Colonoscopy: [facility]  PAP:  Bone density:   No Known Allergies  Current Outpatient  Prescriptions  Medication Sig Dispense Refill  . AMLODIPINE BESYLATE PO Take 5 mg by mouth daily.     . celecoxib (CELEBREX) 200 MG capsule Take 200 mg by mouth 2 (two) times daily.    Marland Kitchen losartan-hydrochlorothiazide (HYZAAR) 100-25 MG tablet Take 1 tablet by mouth daily.    . meloxicam (MOBIC) 15 MG tablet Take 15 mg by mouth daily.    Marland Kitchen rOPINIRole (REQUIP) 3 MG tablet Take 3 mg by mouth at bedtime.    Marland Kitchen ibuprofen (ADVIL,MOTRIN) 800 MG tablet Take 1 tablet (800 mg total) by mouth 3 (three) times daily. 21 tablet 0   No current facility-administered medications for this visit.     OBJECTIVE: Morbidly obese African-American woman in no acute distress  Vitals:   07/20/16 0825  BP: (!) 141/61  Pulse: 72  Resp: 18  Temp: 98.3 F (36.8 C)     Body mass index is 59.46 kg/m.   Filed Weights   07/20/16 0825  Weight: (!) 368 lb 6.4 oz (167.1 kg)       ECOG FS:0 - Asymptomatic  Ocular: Sclerae unicteric, pupils round and equal Ear-nose-throat: Oropharynx clear and moist Lymphatic: No cervical or supraclavicular adenopathy Lungs no rales or rhonchi Heart regular rate and rhythm Abd soft, nontender, positive bowel sounds MSK no focal spinal tenderness, no joint edema Neuro: non-focal, well-oriented, appropriate affect Breasts: I do not palpate a mass in either breast. There are no skin or nipple changes of concern. Both axillae are benign.   LAB RESULTS:  CMP     Component Value Date/Time   NA 140 07/20/2016 0806   K 3.8 07/20/2016 0806   CO2 30 (H) 07/20/2016 0806   GLUCOSE 101 07/20/2016 0806   BUN 19.2 07/20/2016 0806   CREATININE 0.8 07/20/2016 0806   CALCIUM 10.0 07/20/2016 0806   PROT 7.4 07/20/2016 0806   ALBUMIN 3.5 07/20/2016 0806   AST 11 07/20/2016 0806   ALT 14 07/20/2016 0806   ALKPHOS 87 07/20/2016 0806   BILITOT 0.37 07/20/2016 0806    No results found for: TOTALPROTELP, ALBUMINELP, A1GS, A2GS, BETS, BETA2SER, GAMS, MSPIKE, SPEI  No results found for:  Nils Pyle, [facility]  Lab Results  Component Value Date   WBC 8.2 07/20/2016   NEUTROABS 5.4 07/20/2016   HGB 11.9 07/20/2016   HCT 37.7 07/20/2016   MCV 71.6 (L) 07/20/2016   PLT 380 07/20/2016      Chemistry      Component Value Date/Time   NA 140 07/20/2016 0806   K 3.8 07/20/2016 0806   CO2 30 (H) 07/20/2016 0806   BUN 19.2 07/20/2016 0806   CREATININE 0.8 07/20/2016 0806      Component Value Date/Time   CALCIUM 10.0 07/20/2016 0806   ALKPHOS 87 07/20/2016 0806   AST 11 07/20/2016 0806   ALT 14 07/20/2016 0806   BILITOT 0.37 07/20/2016 0806       No results found for: LABCA2  No components found for: [id number]  No results for input(s): INR in the last 168 hours.  Urinalysis No results found for: COLORURINE, APPEARANCEUR, LABSPEC, PHURINE, GLUCOSEU, HGBUR, BILIRUBINUR, KETONESUR, PROTEINUR, UROBILINOGEN, NITRITE, LEUKOCYTESUR   STUDIES: Mm Clip Placement Right  Result Date: 07/06/2016 CLINICAL  DATA:  Evaluate clip placement following stereotactic guided right breast biopsy. EXAM: DIAGNOSTIC RIGHT MAMMOGRAM POST STEREOTACTIC BIOPSY COMPARISON:  Previous exam(s). FINDINGS: Mammographic images were obtained following stereotactic guided biopsy of calcifications within the upper-outer right breast. The coil shaped clip has migrated and lies 3.5 cm inferior to the remaining calcifications. IMPRESSION: Migrated coil shaped clip which lies 3.5 cm inferior to remaining calcifications. Final Assessment: Post Procedure Mammograms for Marker Placement Electronically Signed   By: Margarette Canada M.D.   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 This was found to be concordant by Dr. Hassan Rowan. Pathology results were  discussed with the patient by telephone. The patient reported doing well after the biopsy. Post biopsy instructions and care were reviewed and questions were answered. The patient was encouraged to call [facility] for any additional concerns. The patient was referred to the [facility] at the [facility] on July 20, 2016. Pathology results reported by Susa Raring RN, BSN on 07/08/2016. Electronically Signed   By: Margarette Canada M.D.   On: 07/08/2016 11:48   Result Date: 07/08/2016 CLINICAL DATA:  56 year old female for tissue sampling of calcifications within the upper-outer right breast. EXAM: RIGHT BREAST STEREOTACTIC CORE NEEDLE BIOPSY COMPARISON:  Previous exams. FINDINGS: The patient and I discussed the procedure of stereotactic-guided biopsy including benefits and alternatives. We discussed the high likelihood of a successful procedure. We discussed the risks of the procedure including infection, bleeding, tissue injury, clip migration, and inadequate sampling. Informed written consent was given. The usual time out protocol was performed immediately prior to the procedure. Using sterile technique and 1% Lidocaine as local anesthetic, under stereotactic guidance, a 9 gauge vacuum assisted device was used to perform core needle biopsy of calcifications in the upper-outer quadrant of the right breast using a superior approach. Specimen radiograph was performed showing calcifications. Specimens with calcifications are identified for pathology. Lesion quadrant: Upper outer quadrant At the conclusion of the procedure, a coil shaped tissue marker clip was deployed into the biopsy cavity. Follow-up 2-view mammogram was performed and dictated separately. IMPRESSION: Stereotactic-guided biopsy of calcifications within the upper-outer right breast. No apparent complications. Electronically Signed: By: Margarette Canada M.D. On: 07/06/2016 12:02     ELIGIBLE FOR AVAILABLE RESEARCH PROTOCOL: no  ASSESSMENT: 56 y.o. [place] woman status post right breast upper outer quadrant biopsy 07/06/2016 for an invasive ductal carcinoma, grade 2, with no prognostic panel available, in the setting of ductal carcinoma in situ, which was estrogen and progesterone receptor negative  (1) definitive surgery pending  (a) the patient is considering bilateral breast reductions  (2) if the invasive component is greater than 0.5 cm we will request an Oncotype or Mammaprint depending on the prognostic panel results  (3) adjuvant radiation as appropriate  (4) anti-estrogens if the invasive tumor proves to be estrogen receptor positive  PLAN: We spent the better part of today's hour-long appointment discussing the biology of breast cancer in general, and the specifics of the patient's tumor in particular. We first reviewed the fact that cancer is not one disease but more than 100 different diseases and that it is important to keep them separate-- otherwise when  friends and relatives discuss their own cancer experiences with Humaira confusion can result. Similarly we explained that if breast cancer spreads to the bone or liver, the patient would not have bone cancer or liver cancer, but breast cancer in the bone and breast cancer in the liver: one cancer in three places-- not 3 different cancers which otherwise would have to be treated in 3 different ways.  We discussed the difference between local and systemic therapy. In terms of loco-regional treatment, lumpectomy plus radiation is equivalent to mastectomy as far as survival is concerned. For this reason, and because the cosmetic results are generally superior, we recommend breast conserving surgery.   We then discussed the rationale for systemic therapy. There is some risk that this cancer may have already spread to other parts of her body. Patients frequently ask at this point about bone scans, CAT scans and  PET scans to find out if they have occult breast cancer somewhere else. The problem is that in early stage disease we are much more likely to find false positives then true cancers and this would expose the patient to unnecessary procedures as well as unnecessary radiation. Scans cannot answer the question the patient really would like to know, which is whether she has microscopic disease elsewhere in her body. For those reasons we do not recommend them.  Of course we would proceed to aggressive evaluation of any symptoms that might suggest metastatic disease, but that is not the case here.  The problem with planning for systemic therapy at this point is that we do not have a prognostic panel. The choices are anti-estrogens if the tumor proves to be estrogen receptor positive, anti-HER-2 treatment if it proves to be HER-2 positive, or chemotherapy if the tumor is larger than 0.5 cm and triple negative. We discussed this in a general way today but we will have a March more definitive discussion once she has her surgery and we have the final pathology report with a complete prognostic panel.  We discussed genetics testing. In patients who carry a deleterious mutation [for example in a  BRCA gene], the risk of a new breast cancer developing in the future may be sufficiently great that the patient may choose bilateral mastectomies. However if she wishes to keep her breasts in that situation it is safe to do so. That would require intensified screening, which generally means not only yearly mammography but a yearly breast MRI as well. Of course, if there is a deleterious mutation bilateral oophorectomy would be necessary as there is no standard screening protocol for ovarian cancer.  At this point the plan is to proceed to breast conserving surgery with bilateral breast reduction, with consideration of adjuvant radiation, and systemic therapy depending on the prognostic panel results.  Rachael has a good  understanding of the overall plan. She agrees with it. She knows the goal of treatment in her case is cure. She will call with any problems that may develop before her next visit here.  Chauncey Cruel, MD   07/20/2016 11:04 AM Medical Oncology and Hematology [facility] [address] [place], [place] [address] Tel. [phone]    Fax. [phone]

## 2016-07-20 NOTE — Therapy (Signed)
[facility] [address] [place], [place], [address] Phone: [phone]   Fax:  [phone]  Physical Therapy Evaluation  Patient Details  Name: Jodi Mccormick MRN: [id number] Date of Birth: 10/28/1960 Referring Provider: Dr. Benjamin Hoxworth  Encounter Date: 07/20/2016      PT End of Session - 07/20/16 1029    Visit Number 1   Number of Visits 1   PT Start Time 0917   PT Stop Time 0942   PT Time Calculation (min) 25 min   Activity Tolerance Patient tolerated treatment well   Behavior During Therapy WFL for tasks assessed/performed      Past Medical History:  Diagnosis Date  . Arthritis   . Hypertension   . Obesity     Past Surgical History:  Procedure Laterality Date  . ABDOMINAL HYSTERECTOMY    . BUNIONECTOMY    . CHOLECYSTECTOMY    . TUBAL LIGATION      There were no vitals filed for this visit.       Subjective Assessment - 07/20/16 0944    Subjective Patient reports she is here today for a baseline assessment of her newly diagnosed right breast cancer.   Patient is accompained by: Family member   Pertinent History Patient was diagnosed on 06/29/16 with right grade 2 invasive ductal carcinoma with DCIS breast cancer. The DCIS mass is ER/PR negative and HER2 insufficient. The invasive mass does not have a prognostic panel completed. The mass measures 1.6 cm in the upper outer quadrant.   Patient Stated Goals Reduce lymphedema risk and learn post op shoulder ROM HEP            [facility] PT Assessment - 07/20/16 0001      Assessment   Medical Diagnosis Right breast cancer   Referring Provider Dr. Benjamin Hoxworth   Onset Date/Surgical Date 06/29/16   Hand Dominance Right   Prior Therapy none     Precautions   Precautions Other (comment)   Precaution Comments active cancer     Restrictions   Weight Bearing Restrictions No     Balance Screen   Has the patient fallen in the past 6 months No    Has the patient had a decrease in activity level because of a fear of falling?  No   Is the patient reluctant to leave their home because of a fear of falling?  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; sits at desk and walks around   Leisure She recently started doing low impact aerobics 2x/week     Cognition   Overall Cognitive Status Within Functional Limits for tasks assessed     Posture/Postural Control   Posture/Postural Control Postural limitations   Postural Limitations Rounded Shoulders;Forward head     ROM / Strength   AROM / PROM / Strength AROM;Strength     AROM   AROM Assessment Site Shoulder;Cervical   Right/Left Shoulder Right;Left   Right Shoulder Extension 38 Degrees   Right Shoulder Flexion 141 Degrees   Right Shoulder ABduction 131 Degrees   Right Shoulder Internal Rotation 69 Degrees   Right Shoulder External Rotation 61 Degrees   Left Shoulder Extension 33 Degrees   Left Shoulder Flexion 134 Degrees     Left Shoulder ABduction 136 Degrees   Left Shoulder Internal Rotation 64 Degrees   Left Shoulder External Rotation 71 Degrees   Cervical Flexion WNl   Cervical Extension WNL   Cervical - Right Side Bend WNL   Cervical - Left Side Bend WNl   Cervical - Right Rotation WNL   Cervical - Left Rotation WNL     Strength   Overall Strength Within functional limits for tasks performed           LYMPHEDEMA/ONCOLOGY QUESTIONNAIRE - 07/20/16 1027      Type   Cancer Type Right breast cancer     Lymphedema Assessments   Lymphedema Assessments Upper extremities     Right Upper Extremity Lymphedema   10 cm Proximal to Olecranon Process 44 cm   Olecranon Process 29.4 cm   10 cm Proximal to Ulnar Styloid  Process 26.7 cm   Just Proximal to Ulnar Styloid Process 18.1 cm   Across Hand at Thumb Web Space 21.7 cm   At Base of 2nd Digit 6.9 cm     Left Upper Extremity Lymphedema   10 cm Proximal to Olecranon Process 45.9 cm   Olecranon Process 29.9 cm   10 cm Proximal to Ulnar Styloid Process 27.5 cm   Just Proximal to Ulnar Styloid Process 18.6 cm   Across Hand at Thumb Web Space 21.5 cm   At Base of 2nd Digit 6.7 cm         Objective measurements completed on examination: See above findings.      Patient was instructed today in a home exercise program today for post op shoulder range of motion. These included active assist shoulder flexion in sitting, scapular retraction, wall walking with shoulder abduction, and hands behind head external rotation.  She was encouraged to do these twice a day, holding 3 seconds and repeating 5 times when permitted by her physician.              PT Education - 07/20/16 1028    Education provided Yes   Education Details Lymphedema risk reduction and post op shoulder ROM HEP   Person(s) Educated Patient;Spouse   Methods Explanation;Demonstration;Handout   Comprehension Returned demonstration;Verbalized understanding              Breast Clinic Goals - 07/20/16 1033      Patient will be able to verbalize understanding of pertinent lymphedema risk reduction practices relevant to her diagnosis specifically related to skin care.   Time 1   Period Days   Status Achieved     Patient will be able to return demonstrate and/or verbalize understanding of the post-op home exercise program related to regaining shoulder range of motion.   Time 1   Period Days   Status Achieved     Patient will be able to verbalize understanding of the importance of attending the postoperative After Breast Cancer Class for further lymphedema risk reduction education and therapeutic exercise.   Time 1   Period Days   Status Achieved                Plan - 07/20/16 1029    Clinical Impression Statement Patient was diagnosed on 06/29/16 with right grade 2 invasive ductal carcinoma with DCIS breast cancer. The DCIS mass is ER/PR negative and HER2 insufficient. The invasive mass does not have a prognostic panel completed. The mass measures 1.6 cm in the upper outer quadrant. Her multidisciplinary medical team met prior to her assessments to determine a   recommended treatment plan. She is planning to have a genetic testing, a right lumpectomy and sentinel node biopsy followed by radiation. She may benefit from post op PT to regain shoulder ROM and reduce lymphedema risk.   History and Personal Factors relevant to plan of care: none   Clinical Presentation Stable   Clinical Presentation due to: Condition is stable   Clinical Decision Making Low   Rehab Potential Excellent   Clinical Impairments Affecting Rehab Potential None   PT Frequency One time visit   PT Treatment/Interventions Patient/family education;Therapeutic exercise   PT Next Visit Plan Will f/u after surgery to determine PT needs   PT Home Exercise Plan Post op shoulder ROM HEP   Consulted and Agree with Plan of Care Patient;Family member/caregiver   Family Member Consulted husband      Patient will benefit from skilled therapeutic intervention in order to improve the following deficits and impairments:  Postural dysfunction, Decreased knowledge of precautions, Pain, Impaired UE functional use, Decreased range of motion  Visit Diagnosis: Carcinoma of upper-outer quadrant of right breast in female, estrogen receptor negative (HCC) - Plan: PT plan of care cert/re-cert  Abnormal posture - Plan: PT plan of care cert/re-cert     Problem List Patient Active Problem List   Diagnosis Date Noted  . Malignant neoplasm of upper-outer quadrant of right breast in female, estrogen receptor negative (HCC) 07/19/2016  . Right shoulder pain 05/09/2013    Marti Cooper Smith, PT 07/20/16  10:35 AM  [facility] [address] [place], [place], [address] Phone: [phone]   Fax:  [phone]  Name: Aymar Linse MRN: [id number] Date of Birth: 02/14/1960  

## 2016-07-22 ENCOUNTER — Ambulatory Visit: Payer: Self-pay | Admitting: General Surgery

## 2016-07-22 DIAGNOSIS — C50411 Malignant neoplasm of upper-outer quadrant of right female breast: Secondary | ICD-10-CM

## 2016-07-26 ENCOUNTER — Telehealth: Payer: Self-pay | Admitting: *Deleted

## 2016-07-26 ENCOUNTER — Telehealth: Payer: Self-pay | Admitting: Genetics

## 2016-07-26 DIAGNOSIS — Z1379 Encounter for other screening for genetic and chromosomal anomalies: Secondary | ICD-10-CM | POA: Insufficient documentation

## 2016-07-26 NOTE — Telephone Encounter (Signed)
  Oncology Nurse Navigator Documentation  Navigator Location: [facility] (07/26/16 1000)   )Navigator Encounter Type: Telephone (07/26/16 1000) Telephone: [facility] Crocker Call;Clinic/[facility]C Follow-up (07/26/16 1000)                                                  Time Spent with Patient: 15 (07/26/16 1000)

## 2016-07-27 ENCOUNTER — Ambulatory Visit: Payer: Self-pay | Admitting: Genetics

## 2016-07-27 NOTE — Telephone Encounter (Signed)
Revealed negative genetic testing for the Hereditary Breast Cancer STAT Panel (9 genes). Discussed that we do not know why she has breast cancer or why there is cancer in the family. It could be due to a different gene that we are not testing, or maybe our current technology may not be able to pick something up.   We discussed the option to have reflex testing for the Common Hereditary Cancer Panel to look at at total of 46 genes associated with cancer predisposition syndromes.   The patient understands this testing is available to her, and declined further testing at this time.   It will be important for her to keep in contact with genetics to keep up with any updated genetic testing that may be available.

## 2016-07-27 NOTE — Progress Notes (Signed)
HPI: Ms. Takacs was previously seen in the [place] clinic due to a personal and family history of cancer and concerns regarding a hereditary predisposition to cancer. Please refer to our prior cancer genetics clinic note for more information regarding Ms. Schmelzle's medical, social and family histories, and our assessment and recommendations, at the time. Ms. Wallis recent genetic test results were disclosed to her, as were recommendations warranted by these results. These results and recommendations are discussed in more detail below.  CANCER HISTORY: Personal History of Breast Cancer at the age of 80.  FAMILY HISTORY:  We obtained a detailed, 4-generation family history.  Significant diagnoses are listed below: Family History  Problem Relation Age of Onset  . Hypertension Mother   . Leukemia Mother 69  . Hypertension Father   . Diabetes Father   . Pancreatic cancer Father 72       died 78  . Breast cancer Paternal Aunt 44       died in 70's  . Kidney disease Maternal Grandmother   . Breast cancer Paternal Aunt 68       died in 20's  . Breast cancer Paternal Aunt 80       died in 22's  . Breast cancer Paternal Aunt 25   The patient has 2 daughters ages 50 and 42 with no history of cancer  The youngest daughter has 2 children, and is currently pregnant .  The oldest daughter (36) has a son and a daughter.  Ms. Billiot has 4 bothers in their 50's; all are cancer free.  One brother is 21, with no children.  Two brothers are 35 year-old identical twins.  One twin has a 83 year-old daughter, and the other with has a 57 year old daughter and a 25 year-old son.    The patient's mother is 51 and was diagnosed with Leukemia at the age of 2.  The maternal grandmother died in her 71's due to kidney disease.  This maternal grandmother had 5 brothers, all cancer free.  No information is know about the patient's maternal grandfather.  The patient's father was  diagnosed with pancreatic cancer at 67, and died at the age of 30.  There are 4 paternal aunts who were diagnosed with breast caner: -One paternal aunt died in her 9's from breast cancer. Age of breast cancer diagnosis unknown              -This paternal aunt had 1 son who died in his 55's -One paternal aunt died in her 13's from breast cancer. Age of breast cancer diagnosis unknown             -This paternal aunt has a son in his 27's that is reportedly cancer free.  -One paternal aunt died in her 70's from breast cancer. Age of breast cancer diagnosis unknown             -This paternal aunt had 2 sons.  One son died at the age of 40 from some unknown type of cancer located around his shoulder. -one paternal aunt was diagnosed with breast cancer in her 78's and is now 21.              -This paternal aunt has a son who is deceased and a daughter in her 16's with no known cancer.   The patient has 5 other paternal aunts and 6 paternal uncles.  No known cancer diagnosis for any of these aunts and uncles. The  patient's paternal grandfather died in his 56's from a medical condition in which blood was in his urine.  This paternal grandfather had a brother who is deceased, and had no known cancer diagnosis.  The paternal grandmother died in her 63's from an unknown cause.  No information is known about  The paternal grandmother's side of the family.    Ms. Tremain is unaware of previous family history of genetic testing for hereditary cancer risks. Patient's maternal ancestors are of African [employer], and paternal ancestors are of African American descent. There is No reported Ashkenazi Jewish ancestry. There is No known consanguinity.  GENETIC TEST RESULTS: Genetic testing reported out on 07/26/2016  through the [facility] Hereditary Breast Cancer STAT panel found no deleterious mutations. The test report has been scanned into EPIC and is located under the Molecular Pathology section of the Results  Review tab. The STAT Breast cancer panel offered by [facility] includes sequencing and rearrangement analysis for the following 9 genes:  ATM, BRCA1, BRCA2, CDH1, CHEK2, PALB2, PTEN, STK11 and TP53.    We offered Ms. Afshar the option to have reflex genetic testing to the Common Hereditary Cancer Panel to analyze a total of 46 genes associated with Hereditary cancer predisposition.  Ms. Coppernoll declined to pursue further genetic testing at this time.    This negative/normal result means that we were unable to identify a hereditary cause for her personal and family history of cancer at this time.  While reassuring, this result does not rule out a hereditary basis for her cancer.   We discussed that it is still possible that there could be a genetic mutation that is undetectable by current technology, or a genetic mutation in genes that have not yet been tested or identified to increase cancer risk.  Therefore, it is important to remain in touch with cancer genetics in the future so that we can continue to offer Ms. Chretien the most up to date genetic testing.      ADDITIONAL GENETIC TESTING: We discussed with Ms. Kerins that there are other genes that are associated with increased cancer risk that can be analyzed. The laboratories that offer such testing look at these additional genes via a hereditary cancer gene panel. She declined further genetic testing at this time.  Should Ms. Landowski wish to pursue additional genetic testing, we are happy to discuss and coordinate this testing, at any time.    CANCER SCREENING RECOMMENDATIONS:   Based on these negative test results, there is no genetic indication to alter surgical decisions for her upcoming breast cancer treatment. This result is reassuring, however given Ms. Hedden's personal and family histories, we must interpret these negative results with some caution.  Her family history does include several members with cancer, one of  which was before the age of 25.  There are many other genes that are associated hereditary cancer predisposition she was not tested for as well as limitations to our knowledge and technology.  There could be a mutation that is undetectable with current technology, or there may be a mutation in a gene that has not yet been tested or identified to be associated with cancer risk.  Based on the information from this genetic test, we have no additional medical management recommendations at this time.  We, therefore, recommended shecontinue to follow the cancer management and screening guidelines provided by her oncology and primary healthcare provider.    RECOMMENDATIONS FOR FAMILY MEMBERS: Women in this family might be at some increased  risk of developing cancer, over the general population risk, simply due to the family history of cancer. We recommended women in this family have a yearly mammogram beginning at age 85, or 5 years younger than the earliest onset of cancer, an annual clinical breast exam, and perform monthly breast self-exams. Women in this family should also have a gynecological exam as recommended by their primary provider. All family members should have a colonoscopy by age 61.  Based on Ms. Appelbaum's family history, her paternal aunt, who was diagnosed with breast cancer in her 22's, is eligible to have genetic counseling and testing if she so chooses.  FOLLOW-UP: Lastly, we discussed with Ms. Aufiero that cancer genetics is a rapidly advancing field and it is possible that new genetic tests will be appropriate for her and/or her family members in the future. We encouraged her to remain in contact with cancer genetics on an annual basis so we can update her personal and family histories and let her know of advances in cancer genetics that may benefit this family.   Our contact number was provided. Ms. Salle questions were answered to her satisfaction, and she knows she is  welcome to call us at anytime with additional questions or concerns.   Ferol Luz, MS [email or web address] [phone]

## 2016-07-28 ENCOUNTER — Encounter: Payer: Self-pay | Admitting: Genetics

## 2016-08-05 ENCOUNTER — Other Ambulatory Visit: Payer: Self-pay | Admitting: General Surgery

## 2016-08-05 DIAGNOSIS — C50411 Malignant neoplasm of upper-outer quadrant of right female breast: Secondary | ICD-10-CM

## 2016-08-12 ENCOUNTER — Encounter (HOSPITAL_COMMUNITY)
Admission: RE | Admit: 2016-08-12 | Discharge: 2016-08-12 | Disposition: A | Discharge status: Home / Self Care | Payer: BLUE CROSS/BLUE SHIELD | Source: Ambulatory Visit | Attending: General Surgery | Admitting: General Surgery

## 2016-08-12 ENCOUNTER — Encounter (HOSPITAL_COMMUNITY): Payer: Self-pay

## 2016-08-12 DIAGNOSIS — Z853 Personal history of malignant neoplasm of breast: Secondary | ICD-10-CM | POA: Insufficient documentation

## 2016-08-12 DIAGNOSIS — I10 Essential (primary) hypertension: Secondary | ICD-10-CM | POA: Insufficient documentation

## 2016-08-12 DIAGNOSIS — G4733 Obstructive sleep apnea (adult) (pediatric): Secondary | ICD-10-CM | POA: Insufficient documentation

## 2016-08-12 DIAGNOSIS — Z01818 Encounter for other preprocedural examination: Secondary | ICD-10-CM | POA: Insufficient documentation

## 2016-08-12 DIAGNOSIS — R011 Cardiac murmur, unspecified: Secondary | ICD-10-CM | POA: Diagnosis not present

## 2016-08-12 HISTORY — DX: Cardiac murmur, unspecified: R01.1

## 2016-08-12 HISTORY — DX: Anemia, unspecified: D64.9

## 2016-08-12 LAB — BASIC METABOLIC PANEL
Anion gap: 9 (ref 5–15)
BUN: 22 mg/dL — ABNORMAL HIGH (ref 6–20)
CO2: 28 mmol/L (ref 22–32)
Calcium: 9.5 mg/dL (ref 8.9–10.3)
Chloride: 103 mmol/L (ref 101–111)
Creatinine, Ser: 0.66 mg/dL (ref 0.44–1.00)
GFR calc Af Amer: >60 mL/min (ref >60)
GFR calc non Af Amer: >60 mL/min (ref >60)
Glucose, Bld: 109 mg/dL — ABNORMAL HIGH (ref 65–99)
Potassium: 3.8 mmol/L (ref 3.5–5.1)
Sodium: 140 mmol/L (ref 135–145)

## 2016-08-12 LAB — CBC
HCT: 37.8 % (ref 36.0–46.0)
Hemoglobin: 11.5 g/dL — ABNORMAL LOW (ref 12.0–15.0)
MCH: 22.4 pg — ABNORMAL LOW (ref 26.0–34.0)
MCHC: 30.4 g/dL (ref 30.0–36.0)
MCV: 73.7 fL — ABNORMAL LOW (ref 78.0–100.0)
Platelets: 379 10*3/uL (ref 150–400)
RBC: 5.13 MIL/uL — ABNORMAL HIGH (ref 3.87–5.11)
RDW: 16.5 % — ABNORMAL HIGH (ref 11.5–15.5)
WBC: 8 10*3/uL (ref 4.0–10.5)

## 2016-08-12 NOTE — Pre-Procedure Instructions (Signed)
Jolissa Kapral  08/12/2016      [id number] - [place], [place] - [address]OSS [address] [place] [address] Phone: [phone] Fax: [phone]    Your procedure is scheduled on August 18, 2016  Report to [facility] Admitting Entrance "A" at 5:30 A.M.  Call this number if you have problems the morning of surgery:  [phone]   Remember:  Do not eat food or drink liquids after midnight on August 17, 2016  Take these medicines the morning of surgery with A SIP OF WATER: AmLODipine (NORVASC). Drink Boost 2 hours before arrival to the hospital.  7 days before surgery stop taking all Aspirins, Vitamins, Fish oils, and Herbal medications. Also stop all NSAIDS i.e. Advil, Motrin, Aleve, Anaprox, Naproxen, BC and Goody Powders that also includes Celecoxib (CELEBREX) and Meloxicam (MOBIC).   Do not wear jewelry, make-up or nail polish.  Do not wear lotions, powders, or perfumes, or deoderant.  Do not shave 48 hours prior to surgery.    Do not bring valuables to the hospital.  [facility] is not responsible for any belongings or valuables.  Contacts, dentures or bridgework may not be worn into surgery.  Leave your suitcase in the car.  After surgery it may be brought to your room.  For patients admitted to the hospital, discharge time will be determined by your treatment team.  Patients discharged the day of surgery will not be allowed to drive home.   Special instructions:    [place]- Preparing For Surgery  Before surgery, you can play an important role. Because skin is not sterile, your skin needs to be as free of germs as possible. You can reduce the number of germs on your skin by washing with CHG (chlorahexidine gluconate) Soap before surgery.  CHG is an antiseptic cleaner which kills germs and bonds with the skin to continue killing germs even after washing.  Please do not use if you have an allergy to  CHG or antibacterial soaps. If your skin becomes reddened/irritated stop using the CHG.  Do not shave (including legs and underarms) for at least 48 hours prior to first CHG shower. It is OK to shave your face.  Please follow these instructions carefully.   1. Shower the NIGHT BEFORE SURGERY and the MORNING OF SURGERY with CHG.   2. If you chose to wash your hair, wash your hair first as usual with your normal shampoo.  3. After you shampoo, rinse your hair and body thoroughly to remove the shampoo.  4. Use CHG as you would any other liquid soap. You can apply CHG directly to the skin and wash gently with a scrungie or a clean washcloth.   5. Apply the CHG Soap to your body ONLY FROM THE NECK DOWN.  Do not use on open wounds or open sores. Avoid contact with your eyes, ears, mouth and genitals (private parts). Wash genitals (private parts) with your normal soap.  6. Wash thoroughly, paying special attention to the area where your surgery will be performed.  7. Thoroughly rinse your body with warm water from the neck down.  8. DO NOT shower/wash with your normal soap after using and rinsing off the CHG Soap.  9. Pat yourself dry with a CLEAN TOWEL.   10. Wear CLEAN PAJAMAS   11. Place CLEAN SHEETS on your bed the night of your first shower and DO NOT SLEEP WITH PETS.  Day of Surgery: Do not apply any deodorants/lotions. Please wear clean clothes to the hospital/surgery center.    Please read over the following fact sheets that you were given. Pain Booklet, Coughing and Deep Breathing and Surgical Site Infection Prevention

## 2016-08-12 NOTE — Progress Notes (Addendum)
PCP - Idamae Schuller  [place]  Cardiologist - Denies  Chest x-ray - Denies  EKG - 08/12/16  Stress Test - 2010  ECHO - Denies  Cardiac Cath - Denies  Sleep Study - Yes CPAP - Yes. Does not remember settings. Pt asked to bring mask the day of surgery.  Chart will be sent to anesthesia for review due to an abnormal EKG. Records requested from Dr. Lovenia Shuck from [facility].    Pt denies having chest pain, sob, or fever at this time. All instructions explained to the pt, with a verbal understanding of the material. Pt agrees to go over the instructions while at home for a better understanding. The opportunity to ask questions was provided.

## 2016-08-15 NOTE — Progress Notes (Addendum)
Anesthesia Chart Review:  Pt is a 56 year old female scheduled for R breast lumpectomy with radioactive seed and sentinel lymph node biopsy on 08/18/2016 with Excell Seltzer, MD  PMH includes:  HTN, OSA, heart murmur, breast cancer. Never smoker. BMI 60  Medications include: Amlodipine, losartan-HCTZ  Preoperative labs reviewed.    EKG 08/12/16: Sinus bradycardia (59 bpm). T wave abnormality, consider lateral ischemia  Pt denied CV sx at PAT at the time EKG was obtained.  I have been unable to locate a prior EKG for comparison.  Pt believes she had a stress test at some point.  I have attempted to track it down without success.  Reviewed case with Dr. Nyoka Cowden.  If no acute CV sx, I anticipate pt can proceed with surgery as scheduled.   Willeen Cass, FNP-BC [facility]/Anesthesiology Phone: [phone] 08/15/2016 2:27 PM  Addendum:   Received records from [facility].  Pt had EKG 01/09/11.  Written report EKG documented sinus rhythm, left atrial enlargement, T abnormality-lateral ischemia.  [facility] is trying to locate a copy of this tracing, but 01/09/11 report seems consistent with EKG findings here 08/12/16.   Willeen Cass, FNP-BC [facility] Stay Surgical Center/Anesthesiology Phone: [phone] 08/17/2016 4:05 PM

## 2016-08-16 ENCOUNTER — Ambulatory Visit
Admission: RE | Admit: 2016-08-16 | Discharge: 2016-08-16 | Disposition: A | Discharge status: Home / Self Care | Payer: BLUE CROSS/BLUE SHIELD | Source: Ambulatory Visit | Attending: General Surgery | Admitting: General Surgery

## 2016-08-16 DIAGNOSIS — C50411 Malignant neoplasm of upper-outer quadrant of right female breast: Secondary | ICD-10-CM

## 2016-08-17 MED ORDER — CEFAZOLIN SODIUM 10 G IJ SOLR
3.0000 g | INTRAMUSCULAR | Status: AC
  Administered 2016-08-18: 3 g via INTRAVENOUS
  Filled 2016-08-17: qty 3

## 2016-08-17 NOTE — Anesthesia Preprocedure Evaluation (Addendum)
Anesthesia Evaluation  Patient identified by MRN, date of birth, ID band Patient awake    Reviewed: Allergy & Precautions, H&P , Patient's Chart, lab work & pertinent test results, reviewed documented beta blocker date and time   Airway Mallampati: II  TM Distance: >3 FB Neck ROM: full    Dental no notable dental hx.    Pulmonary    Pulmonary exam normal breath sounds clear to auscultation       Cardiovascular hypertension,  Rhythm:regular Rate:Normal     Neuro/Psych    GI/Hepatic   Endo/Other    Renal/GU      Musculoskeletal   Abdominal   Peds  Hematology   Anesthesia Other Findings MO OSA HTN  Reproductive/Obstetrics                             Anesthesia Physical Anesthesia Plan  ASA: II  Anesthesia Plan: General   Post-op Pain Management:    Induction: Intravenous  PONV Risk Score and Plan: 2 and 3 and Ondansetron, Dexamethasone, Propofol and Midazolam  Airway Management Planned: Oral ETT  Additional Equipment:   Intra-op Plan:   Post-operative Plan: Extubation in OR  Informed Consent: I have reviewed the patients History and Physical, chart, labs and discussed the procedure including the risks, benefits and alternatives for the proposed anesthesia with the patient or authorized representative who has indicated his/her understanding and acceptance.   Dental Advisory Given  Plan Discussed with: CRNA and Surgeon  Anesthesia Plan Comments: (  )       Anesthesia Quick Evaluation

## 2016-08-18 ENCOUNTER — Ambulatory Visit (HOSPITAL_COMMUNITY): Payer: BLUE CROSS/BLUE SHIELD | Admitting: Emergency Medicine

## 2016-08-18 ENCOUNTER — Encounter (HOSPITAL_COMMUNITY)
Admission: RE | Disposition: A | Discharge status: Home / Self Care | Payer: Self-pay | Source: Ambulatory Visit | Attending: General Surgery

## 2016-08-18 ENCOUNTER — Ambulatory Visit (HOSPITAL_COMMUNITY): Payer: BLUE CROSS/BLUE SHIELD | Admitting: Anesthesiology

## 2016-08-18 ENCOUNTER — Encounter (HOSPITAL_COMMUNITY)
Admission: RE | Admit: 2016-08-18 | Discharge: 2016-08-18 | Disposition: A | Discharge status: Home / Self Care | Payer: BLUE CROSS/BLUE SHIELD | Source: Ambulatory Visit | Attending: General Surgery | Admitting: General Surgery

## 2016-08-18 ENCOUNTER — Ambulatory Visit (HOSPITAL_COMMUNITY)
Admission: RE | Admit: 2016-08-18 | Discharge: 2016-08-18 | Disposition: A | Discharge status: Home / Self Care | Payer: BLUE CROSS/BLUE SHIELD | Source: Ambulatory Visit | Attending: General Surgery | Admitting: General Surgery

## 2016-08-18 ENCOUNTER — Encounter (HOSPITAL_COMMUNITY): Payer: Self-pay

## 2016-08-18 ENCOUNTER — Ambulatory Visit
Admission: RE | Admit: 2016-08-18 | Discharge: 2016-08-18 | Disposition: A | Discharge status: Home / Self Care | Payer: BLUE CROSS/BLUE SHIELD | Source: Ambulatory Visit | Attending: General Surgery | Admitting: General Surgery

## 2016-08-18 DIAGNOSIS — I10 Essential (primary) hypertension: Secondary | ICD-10-CM | POA: Diagnosis not present

## 2016-08-18 DIAGNOSIS — D0511 Intraductal carcinoma in situ of right breast: Secondary | ICD-10-CM | POA: Insufficient documentation

## 2016-08-18 DIAGNOSIS — Z79899 Other long term (current) drug therapy: Secondary | ICD-10-CM | POA: Diagnosis not present

## 2016-08-18 DIAGNOSIS — C50411 Malignant neoplasm of upper-outer quadrant of right female breast: Secondary | ICD-10-CM

## 2016-08-18 DIAGNOSIS — K219 Gastro-esophageal reflux disease without esophagitis: Secondary | ICD-10-CM | POA: Diagnosis not present

## 2016-08-18 DIAGNOSIS — G4733 Obstructive sleep apnea (adult) (pediatric): Secondary | ICD-10-CM | POA: Diagnosis not present

## 2016-08-18 HISTORY — PX: BREAST LUMPECTOMY WITH RADIOACTIVE SEED AND SENTINEL LYMPH NODE BIOPSY: SHX6550

## 2016-08-18 SURGERY — BREAST LUMPECTOMY WITH RADIOACTIVE SEED AND SENTINEL LYMPH NODE BIOPSY
Anesthesia: General | Site: Breast | Laterality: Right

## 2016-08-18 MED ORDER — GABAPENTIN 300 MG PO CAPS
300.0000 mg | ORAL_CAPSULE | ORAL | Status: AC
  Administered 2016-08-18: 300 mg via ORAL

## 2016-08-18 MED ORDER — LIDOCAINE HCL (CARDIAC) 20 MG/ML IV SOLN
INTRAVENOUS | Status: DC | PRN
  Administered 2016-08-18: 80 mg via INTRAVENOUS

## 2016-08-18 MED ORDER — SODIUM CHLORIDE 0.9 % IJ SOLN
INTRAMUSCULAR | Status: AC
  Filled 2016-08-18: qty 10

## 2016-08-18 MED ORDER — PHENYLEPHRINE 40 MCG/ML (10ML) SYRINGE FOR IV PUSH (FOR BLOOD PRESSURE SUPPORT)
PREFILLED_SYRINGE | INTRAVENOUS | Status: DC | PRN
  Administered 2016-08-18: 80 ug via INTRAVENOUS

## 2016-08-18 MED ORDER — CELECOXIB 200 MG PO CAPS
400.0000 mg | ORAL_CAPSULE | ORAL | Status: AC
  Administered 2016-08-18: 400 mg via ORAL

## 2016-08-18 MED ORDER — FENTANYL CITRATE (PF) 100 MCG/2ML IJ SOLN: 25.0000 ug | INTRAMUSCULAR | Status: DC | PRN

## 2016-08-18 MED ORDER — BUPIVACAINE-EPINEPHRINE 0.25% -1:200000 IJ SOLN
INTRAMUSCULAR | Status: DC | PRN
  Administered 2016-08-18: 20 mL

## 2016-08-18 MED ORDER — PROPOFOL 10 MG/ML IV BOLUS
INTRAVENOUS | Status: AC
  Filled 2016-08-18: qty 20

## 2016-08-18 MED ORDER — GLYCOPYRROLATE 0.2 MG/ML IJ SOLN
INTRAMUSCULAR | Status: DC | PRN
  Administered 2016-08-18 (×2): .1 mg via INTRAVENOUS

## 2016-08-18 MED ORDER — HYDROCODONE-ACETAMINOPHEN 5-325 MG PO TABS: 1.0000 | ORAL_TABLET | ORAL | 0 refills | Status: DC | PRN

## 2016-08-18 MED ORDER — DEXAMETHASONE SODIUM PHOSPHATE 10 MG/ML IJ SOLN
INTRAMUSCULAR | Status: DC | PRN
  Administered 2016-08-18: 10 mg via INTRAVENOUS

## 2016-08-18 MED ORDER — FENTANYL CITRATE (PF) 250 MCG/5ML IJ SOLN
INTRAMUSCULAR | Status: AC
  Filled 2016-08-18: qty 5

## 2016-08-18 MED ORDER — CHLORHEXIDINE GLUCONATE CLOTH 2 % EX PADS: 6.0000 | MEDICATED_PAD | Freq: Once | CUTANEOUS | Status: DC

## 2016-08-18 MED ORDER — MIDAZOLAM HCL 2 MG/2ML IJ SOLN
INTRAMUSCULAR | Status: AC
  Filled 2016-08-18: qty 2

## 2016-08-18 MED ORDER — TECHNETIUM TC 99M SULFUR COLLOID FILTERED
1.0000 | Freq: Once | INTRAVENOUS | Status: AC | PRN
Start: 2016-08-18 — End: 2016-08-18
  Administered 2016-08-18: 1 via INTRADERMAL

## 2016-08-18 MED ORDER — MIDAZOLAM HCL 5 MG/5ML IJ SOLN
INTRAMUSCULAR | Status: DC | PRN
  Administered 2016-08-18: 2 mg via INTRAVENOUS

## 2016-08-18 MED ORDER — 0.9 % SODIUM CHLORIDE (POUR BTL) OPTIME
TOPICAL | Status: DC | PRN
  Administered 2016-08-18: 1000 mL

## 2016-08-18 MED ORDER — LIDOCAINE HCL (CARDIAC) 20 MG/ML IV SOLN
INTRAVENOUS | Status: AC
  Filled 2016-08-18: qty 5

## 2016-08-18 MED ORDER — DEXAMETHASONE SODIUM PHOSPHATE 10 MG/ML IJ SOLN
INTRAMUSCULAR | Status: AC
  Filled 2016-08-18: qty 1

## 2016-08-18 MED ORDER — GABAPENTIN 300 MG PO CAPS
ORAL_CAPSULE | ORAL | Status: AC
  Administered 2016-08-18: 300 mg via ORAL
  Filled 2016-08-18: qty 1

## 2016-08-18 MED ORDER — CELECOXIB 200 MG PO CAPS
ORAL_CAPSULE | ORAL | Status: AC
  Administered 2016-08-18: 400 mg via ORAL
  Filled 2016-08-18: qty 2

## 2016-08-18 MED ORDER — PROPOFOL 10 MG/ML IV BOLUS
INTRAVENOUS | Status: DC | PRN
  Administered 2016-08-18: 200 mg via INTRAVENOUS

## 2016-08-18 MED ORDER — ONDANSETRON HCL 4 MG/2ML IJ SOLN
INTRAMUSCULAR | Status: DC | PRN
  Administered 2016-08-18: 4 mg via INTRAVENOUS

## 2016-08-18 MED ORDER — FENTANYL CITRATE (PF) 100 MCG/2ML IJ SOLN
INTRAMUSCULAR | Status: DC | PRN
  Administered 2016-08-18: 100 ug via INTRAVENOUS
  Administered 2016-08-18: 75 ug via INTRAVENOUS
  Administered 2016-08-18: 25 ug via INTRAVENOUS
  Administered 2016-08-18: 50 ug via INTRAVENOUS

## 2016-08-18 MED ORDER — BUPIVACAINE-EPINEPHRINE 0.25% -1:200000 IJ SOLN
INTRAMUSCULAR | Status: AC
  Filled 2016-08-18: qty 1

## 2016-08-18 MED ORDER — DEXMEDETOMIDINE HCL IN NACL 200 MCG/50ML IV SOLN
INTRAVENOUS | Status: DC | PRN
  Administered 2016-08-18: 12 ug via INTRAVENOUS

## 2016-08-18 MED ORDER — ACETAMINOPHEN 500 MG PO TABS
ORAL_TABLET | ORAL | Status: AC
  Administered 2016-08-18: 1000 mg via ORAL
  Filled 2016-08-18: qty 2

## 2016-08-18 MED ORDER — LACTATED RINGERS IV SOLN
INTRAVENOUS | Status: DC | PRN
  Administered 2016-08-18 (×2): via INTRAVENOUS

## 2016-08-18 MED ORDER — ONDANSETRON HCL 4 MG/2ML IJ SOLN
INTRAMUSCULAR | Status: AC
  Filled 2016-08-18: qty 2

## 2016-08-18 MED ORDER — METHYLENE BLUE 0.5 % INJ SOLN
INTRAVENOUS | Status: AC
  Filled 2016-08-18: qty 10

## 2016-08-18 MED ORDER — PHENYLEPHRINE HCL 10 MG/ML IJ SOLN
INTRAVENOUS | Status: DC | PRN
  Administered 2016-08-18: 50 ug/min via INTRAVENOUS

## 2016-08-18 MED ORDER — ACETAMINOPHEN 500 MG PO TABS
1000.0000 mg | ORAL_TABLET | ORAL | Status: AC
  Administered 2016-08-18: 1000 mg via ORAL

## 2016-08-18 MED ORDER — ROCURONIUM BROMIDE 50 MG/5ML IV SOLN
INTRAVENOUS | Status: AC
  Filled 2016-08-18: qty 1

## 2016-08-18 MED ORDER — SUCCINYLCHOLINE CHLORIDE 20 MG/ML IJ SOLN
INTRAMUSCULAR | Status: DC | PRN
  Administered 2016-08-18: 120 mg via INTRAVENOUS

## 2016-08-18 SURGICAL SUPPLY — 57 items
APPLIER CLIP 9.375 MED OPEN (MISCELLANEOUS)
BINDER BREAST 3XL (BIND) ×3 IMPLANT
BINDER BREAST LRG (GAUZE/BANDAGES/DRESSINGS) IMPLANT
BINDER BREAST XLRG (GAUZE/BANDAGES/DRESSINGS) IMPLANT
BLADE SURG 10 STRL SS (BLADE) ×3 IMPLANT
BLADE SURG 15 STRL LF DISP TIS (BLADE) ×2 IMPLANT
BLADE SURG 15 STRL SS (BLADE) ×4
CANISTER SUCT 3000ML PPV (MISCELLANEOUS) ×3 IMPLANT
CHLORAPREP W/TINT 26ML (MISCELLANEOUS) ×3 IMPLANT
CLIP APPLIE 9.375 MED OPEN (MISCELLANEOUS) IMPLANT
CLIP VESOCCLUDE MED 6/CT (CLIP) ×3 IMPLANT
CONT SPEC 4OZ CLIKSEAL STRL BL (MISCELLANEOUS) ×3 IMPLANT
COVER PROBE W GEL 5X96 (DRAPES) ×3 IMPLANT
COVER SURGICAL LIGHT HANDLE (MISCELLANEOUS) ×3 IMPLANT
DERMABOND ADVANCED (GAUZE/BANDAGES/DRESSINGS) ×2
DERMABOND ADVANCED .7 DNX12 (GAUZE/BANDAGES/DRESSINGS) ×1 IMPLANT
DEVICE DUBIN SPECIMEN MAMMOGRA (MISCELLANEOUS) ×3 IMPLANT
DRAPE CHEST BREAST 15X10 FENES (DRAPES) ×3 IMPLANT
DRAPE SURG 17X23 STRL (DRAPES) ×12 IMPLANT
DRAPE UTILITY XL STRL (DRAPES) ×3 IMPLANT
ELECT COATED BLADE 2.86 ST (ELECTRODE) ×3 IMPLANT
ELECT REM PT RETURN 9FT ADLT (ELECTROSURGICAL) ×3
ELECTRODE REM PT RTRN 9FT ADLT (ELECTROSURGICAL) ×1 IMPLANT
GLOVE BIO SURGEON STRL SZ 6 (GLOVE) ×3 IMPLANT
GLOVE BIOGEL PI IND STRL 6.5 (GLOVE) ×3 IMPLANT
GLOVE BIOGEL PI IND STRL 8 (GLOVE) ×1 IMPLANT
GLOVE BIOGEL PI INDICATOR 6.5 (GLOVE) ×6
GLOVE BIOGEL PI INDICATOR 8 (GLOVE) ×2
GLOVE ECLIPSE 6.0 STRL STRAW (GLOVE) ×3 IMPLANT
GLOVE ECLIPSE 7.5 STRL STRAW (GLOVE) ×6 IMPLANT
GLOVE SURG SS PI 6.5 STRL IVOR (GLOVE) ×6 IMPLANT
GOWN STRL REUS W/ TWL LRG LVL3 (GOWN DISPOSABLE) ×3 IMPLANT
GOWN STRL REUS W/ TWL XL LVL3 (GOWN DISPOSABLE) ×1 IMPLANT
GOWN STRL REUS W/TWL LRG LVL3 (GOWN DISPOSABLE) ×6
GOWN STRL REUS W/TWL XL LVL3 (GOWN DISPOSABLE) ×2
ILLUMINATOR WAVEGUIDE N/F (MISCELLANEOUS) IMPLANT
KIT BASIN OR (CUSTOM PROCEDURE TRAY) ×3 IMPLANT
KIT MARKER MARGIN INK (KITS) ×3 IMPLANT
NDL SAFETY ECLIPSE 18X1.5 (NEEDLE) IMPLANT
NEEDLE FILTER BLUNT 18X 1/2SAF (NEEDLE)
NEEDLE FILTER BLUNT 18X1 1/2 (NEEDLE) IMPLANT
NEEDLE HYPO 18GX1.5 SHARP (NEEDLE)
NEEDLE HYPO 25X1 1.5 SAFETY (NEEDLE) ×3 IMPLANT
NS IRRIG 1000ML POUR BTL (IV SOLUTION) ×3 IMPLANT
PACK SURGICAL SETUP 50X90 (CUSTOM PROCEDURE TRAY) ×3 IMPLANT
PAD ABD 8X10 STRL (GAUZE/BANDAGES/DRESSINGS) ×3 IMPLANT
PENCIL BUTTON HOLSTER BLD 10FT (ELECTRODE) ×3 IMPLANT
SPONGE LAP 18X18 X RAY DECT (DISPOSABLE) ×3 IMPLANT
SUT MON AB 5-0 PS2 18 (SUTURE) ×3 IMPLANT
SUT VIC AB 3-0 SH 18 (SUTURE) ×6 IMPLANT
SYR BULB 3OZ (MISCELLANEOUS) ×3 IMPLANT
SYR CONTROL 10ML LL (SYRINGE) ×3 IMPLANT
TOWEL OR 17X24 6PK STRL BLUE (TOWEL DISPOSABLE) IMPLANT
TOWEL OR 17X26 10 PK STRL BLUE (TOWEL DISPOSABLE) ×3 IMPLANT
TUBE CONNECTING 12'X1/4 (SUCTIONS) ×1
TUBE CONNECTING 12X1/4 (SUCTIONS) ×2 IMPLANT
YANKAUER SUCT BULB TIP NO VENT (SUCTIONS) ×3 IMPLANT

## 2016-08-18 NOTE — Transfer of Care (Cosign Needed)
Immediate Anesthesia Transfer of Care Note  Patient: Jodi Mccormick  Procedure(s) Performed: Procedure(s): RADIOACTIVE SEED GUIDED RIGHT BREAST LUMPECTOMY, RIGHT AXILLARY SENTINEL LYMPH NODE BIOPSY. (Right)  Patient Location: PACU  Anesthesia Type:General  Level of Consciousness: oriented, drowsy and patient cooperative  Airway & Oxygen Therapy: Patient Spontanous Breathing and Patient connected to face mask oxygen  Post-op Assessment: Report given to RN, Post -op Vital signs reviewed and stable and Patient moving all extremities X 4  Post vital signs: Reviewed and stable  Last Vitals:  Vitals:   08/18/16 0614  BP: (!) 171/88  Pulse: (!) 55  Resp: 20  Temp: 36.8 C   BP 160/91 HR 91 RR 24 Sp02 97% Last Pain:  Vitals:   08/18/16 0614  TempSrc: Oral  PainSc:          Complications: No apparent anesthesia complications

## 2016-08-18 NOTE — H&P (Signed)
History of Present Illness Marland Kitchen T. Verlin Duke MD; 07/20/2016 10:44 AM) The patient is a 56 year old female who presents with breast cancer. She is a post menopausal female referred by Dr. Margarette Canada for evaluation of recently diagnosed carcinoma of the right breast. She recently presented for a screening mamogram revealing a new clustered area of suspicious calcifications in the right breast.. Subsequent imaging included diagnostic mamogram showing a 1.6 cm area of pleomorphic calcifications. 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 She is seen now in [facility] for initial treatment planning. She has experienced no breast symptoms, specifically lump or nipple discharge or skin changes. She does not have a personal history of any previous breast problems.  Findings at that time were the following: Tumor size: 1.6 cm Tumor grade: 2-3 Estrogen Receptor: 0% negative on DCIS only Progesterone Receptor: 0% negative on DCIS only insufficient tissue for prognostic panel on invasive disease    Past Surgical History Tawni Pummel, RN; 07/20/2016 7:22 AM) Breast Biopsy  Right. Foot Surgery  Left. Gallbladder Surgery - Laparoscopic  Hysterectomy (not due to cancer) - Complete   Diagnostic Studies History Tawni Pummel, RN; 07/20/2016 7:22 AM) Colonoscopy  1-5 years ago Mammogram  within last year Pap Smear  >5 years ago  Medication History Tawni Pummel, RN; 07/20/2016 7:22 AM) Medications Reconciled  Social History Tawni Pummel, RN; 07/20/2016 7:22 AM) Alcohol use  Occasional alcohol use. Caffeine use  Carbonated beverages, Coffee. No drug use  Tobacco use  Never smoker.  Family History Tawni Pummel, RN; 07/20/2016 7:22 AM) Alcohol Abuse  Brother, Father. Arthritis  Mother. Breast Cancer   Family Members In General. Cerebrovascular Accident  Mother. Diabetes Mellitus  Father. Heart Disease  Brother. Hypertension  Brother, Mother. Kidney Disease  Family Members In General. Malignant Neoplasm Of Pancreas  Father.  Pregnancy / Birth History Tawni Pummel, RN; 07/20/2016 7:22 AM) Age at menarche  71 years. Age of menopause  39-50 Gravida  4 Length (months) of breastfeeding  3-6 Maternal age  36-20 Para  2  Other Problems Tawni Pummel, RN; 07/20/2016 7:22 AM) Arthritis  Cholelithiasis  Gastroesophageal Reflux Disease  Heart murmur  High blood pressure  Lump In Breast  Sleep Apnea     Review of Systems Sunday Spillers Ledford RN; 07/20/2016 7:22 AM) General Not Present- Appetite Loss, Chills, Fatigue, Fever, Night Sweats, Weight Gain and Weight Loss. Skin Not Present- Change in Wart/Mole, Dryness, Hives, Jaundice, New Lesions, Non-Healing Wounds, Rash and Ulcer. HEENT Present- Wears glasses/contact lenses. Not Present- Earache, Hearing Loss, Hoarseness, Nose Bleed, Oral Ulcers, Ringing in the Ears, Seasonal Allergies, Sinus Pain, Sore Throat, Visual Disturbances and Yellow Eyes. Respiratory Present- Snoring. Not Present- Bloody sputum, Chronic Cough, Difficulty Breathing and Wheezing. Breast Present- Breast Mass. Not Present- Breast Pain, Nipple Discharge and Skin Changes. Cardiovascular Not Present- Chest Pain, Difficulty Breathing Lying Down, Leg Cramps, Palpitations, Rapid Heart Rate, Shortness of Breath and Swelling of Extremities. Female Genitourinary Not Present- Frequency, Nocturia, Painful Urination, Pelvic Pain and Urgency. Musculoskeletal Present- Joint Pain. Not Present- Back Pain, Joint Stiffness, Muscle Pain, Muscle Weakness and Swelling of Extremities. Neurological Not Present- Decreased Memory, Fainting, Headaches, Numbness, Seizures, Tingling, Tremor, Trouble walking and Weakness. Psychiatric Not Present- Anxiety, Bipolar, Change in Sleep  Pattern, Depression, Fearful and Frequent crying. Endocrine Not Present- Cold Intolerance, Excessive Hunger, Hair Changes, Heat Intolerance, Hot  flashes and New Diabetes. Hematology Not Present- Blood Thinners, Easy Bruising, Excessive bleeding, Gland problems, HIV and Persistent Infections.   Physical Exam Marland Kitchen T. Armanda Forand MD; 07/20/2016 10:45 AM) The physical exam findings are as follows: Note:General: Alert, orbidly obese African-American female in no distress Skin: Warm and dry. HEENT: No palpable masses or thyromegaly. Sclera nonicteric. Pupils equal round and reactive. Lymph nodes: No cervical, supraclavicular, nodes palpable. breasts: Very large pendulous breasts bilaterally. There is a chronic monilial rash in the inframammary crease bilaterally. No palpable masses in either breast. No skin changes or nipple crusting. No palpable axillary adenopathy. Lungs: Breath sounds clear and equal. No wheezing or increased work of breathing. Cardiovascular: Regular rate and rhythm, grade 2/6 systolic murmer. No JVD or edema. Abdomen: obese. Nondistended. Soft and nontender. No masses palpable. No organomegaly. No palpable hernias. Extremities: No edema or joint swelling or deformity. No chronic venous stasis changes. Neurologic: Alert and fully oriented. Gait normal. No focal weakness. Psychiatric: Normal mood and affect. Thought content appropriate with normal judgement and insight    Assessment & Plan Marland Kitchen T. Britne Borelli MD; 07/20/2016 10:51 AM) MALIGNANT NEOPLASM OF UPPER-OUTER QUADRANT OF RIGHT FEMALE BREAST, UNSPECIFIED ESTROGEN RECEPTOR STATUS (C50.411) Impression: 56 year old female with a new diagnosis of cancer of the right breast, upper outer quadrant. Clinical stage 1 a, ER and PR negative on the in situ component only. I discussed with the patient and her husband today initial surgical treatment options. We discussed options of breast conservation with lumpectomy or total  mastectomy and sentinal lymph node biopsy/dissection. she would certainly be a good candidate for breast conservation which is what she would prefer. we discussed that her breast size would make postoperative radiation difficulty and we could consider MammoSite or breast reduction at the time of her lumpectomy. 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 She is very interested in this and we will refer her to plastic surgery.we discussed sentinel lymph node biopsy at the time of her lumpectomy.We discussed the indications and nature of the procedure, and expected recovery, in detail. Surgical risks including anesthetic complications, cardiorespiratory complications, bleeding, infection, wound healing complications, blood clots, lymphedema, local and distant recurrence and possible need for further surgery based on the final pathology was discussed and understood. Chemotherapy, hormonal therapy and radiation therapy have been discussed. genetic counseling will be done due to her significant family history which could change her decision. I will plan to see her back in the office for final treatment planning following plastic surgery consultation and genetic counseling. Current Plans Referred to Genetic Counseling, for evaluation and follow up (Medical Genetics). Routine. Referred to Surgery - Plastic, for evaluation and follow up (Plastic Surgery). Routine.  Subsequently genetic testing has been negative. She has been evaluated by plastic surgery and we plan a right breast lumpectomy in conjunction with a bilateral breast reduction after pathology report returns with negative margins. Patient has been marked by Dr. Iran Planas.

## 2016-08-18 NOTE — Interval H&P Note (Signed)
History and Physical Interval Note:  08/18/2016 7:17 AM  Jodi Mccormick  has presented today for surgery, with the diagnosis of RIGHT BREAST CANCER  The various methods of treatment have been discussed with the patient and family. After consideration of risks, benefits and other options for treatment, the patient has consented to  Procedure(s): BREAST LUMPECTOMY WITH RADIOACTIVE SEED AND SENTINEL LYMPH NODE BIOPSY (Right) as a surgical intervention .  The patient's history has been reviewed, patient examined, no change in status, stable for surgery.  I have reviewed the patient's chart and labs.  Questions were answered to the patient's satisfaction.     Charliee Krenz T

## 2016-08-18 NOTE — Anesthesia Procedure Notes (Signed)
Procedure Name: Intubation Date/Time: 08/18/2016 7:38 AM Performed by: Rejeana Brock L Pre-anesthesia Checklist: Patient identified, Emergency Drugs available, Suction available and Patient being monitored Patient Re-evaluated:Patient Re-evaluated prior to induction Oxygen Delivery Method: Circle system utilized Preoxygenation: Pre-oxygenation with 100% oxygen Induction Type: IV induction Ventilation: Mask ventilation without difficulty Laryngoscope Size: Glidescope and 4 Grade View: Grade I Tube type: Oral Tube size: 7.0 mm Number of attempts: 2 Airway Equipment and Method: Stylet and Video-laryngoscopy Placement Confirmation: ETT inserted through vocal cords under direct vision,  positive ETCO2 and breath sounds checked- equal and bilateral Secured at: 22 cm Tube secured with: Tape Dental Injury: Teeth and Oropharynx as per pre-operative assessment  Difficulty Due To: Difficult Airway- due to anterior larynx and Difficulty was anticipated Comments: Patient ramped with blankets, DL with miller 2, grade 3 view. Large epiglottis and anterior airway. Glidescope 4 blade grade 1 view. Front left tooth with chip as per preoperative assessment. Dentition unchanged post induction.

## 2016-08-18 NOTE — Op Note (Signed)
Preoperative Diagnosis: RIGHT BREAST CANCER  Postoprative Diagnosis: RIGHT BREAST CANCER  Procedure: Procedure(s): RADIOACTIVE SEED GUIDED RIGHT BREAST LUMPECTOMY, RIGHT AXILLARY SENTINEL LYMPH NODE BIOPSY.   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 Sentinel lymph node biopsy is planned. This is all been discussed with the patient in detail including alternatives and nature of surgery and risks and she is in agreement.    Procedure Detail:  Patient had undergone placement of a radioactive seed at the site of the calcifications of the upper outer right breast with the seed placement film showing the seed about 1.5 cm lateral to the grouped area of calcifications which measured just under 2 cm. The original biopsy clip had migrated about 3 cm inferior to the calcifications. She underwent injection of 1 mCi of technetium sulfur colloid intradermally around the right nipple in the holding area. She was brought to the operating room, placed in the supine position on the operating table, and general endotracheal anesthesia induced. She received preoperative IV antibiotics. PAS were in place. The right breast and axilla and upper arm were widely sterilely prepped and draped. Patient timeout was performed and correct procedure verified. I used one of the marked incision lines obliquely in the right upper quadrant after localizing the seed just above this marked line. Dissection was carried into the subcutaneous tissue.  A skin subcutaneous flap was raised superiorly for several centimeters to expose the area of the seed. Using the neoprobe for guidance I then excised a very generous lumpectomy specimen accounting for the placement of the seed somewhat lateral to the calcifications. An approximately 10 cm lumpectomy specimen was excised with cautery. This was inked for margins. Specimen x-ray showed the calcifications contained centrally within the specimen, the seed present in the original marking clip in the inferior portion of the specimen. This was sent for permanent pathology. Using the neoprobe on technetium setting through the same incision I was able to expose the axilla. Dissection was carried down through the clavipectoral fascia. Using the neoprobe for guidance I dissected down upon a slightly enlarged node with very high counts high in the axilla. This was exposed and excised with cautery. Ex vivo the node had counts of approximately 1800. Following this a second node medially and lower in the axilla which was palpable but not obviously abnormal had moderately elevated counts was completely excised and ex vivo had counts of about 100. Finally a more inferior node in the axilla was localized with the neoprobe with high counts and it was dissected out and excised and had counts of approximately 1000. At this point background in the axilla was less than 20 and I could not feel any palpable adenopathy. Hemostasis was assured in the wound. Soft tissue was infiltrated with Marcaine. The deep axillary tissue is closed with interrupted 3-0 Vicryl. Breast tissue was loosely reapproximated with 3-0 Vicryl and subcutaneous was closed with interrupted 3-0 Vicryl and skin with running subcuticular 4-0 Monocryl and Dermabond. Sponge needle and is to make counts were correct.    Findings: As above  Estimated Blood Loss:  Minimal         Drains: None  Blood Given: none          Specimens: #1 right breast lumpectomy for  margins    #2 right axillary sentinel lymph nodes X 3        Complications:  * No complications entered in OR log *         Disposition: PACU - hemodynamically stable.         Condition: stable

## 2016-08-18 NOTE — Discharge Instructions (Signed)
[facility] °Office Phone Number [phone] ° °BREAST BIOPSY/ PARTIAL MASTECTOMY: POST OP INSTRUCTIONS ° °Always review your discharge instruction sheet given to you by the facility where your surgery was performed. ° °IF YOU HAVE DISABILITY OR FAMILY LEAVE FORMS, YOU MUST BRING THEM TO THE OFFICE FOR PROCESSING.  DO NOT GIVE THEM TO YOUR DOCTOR. ° °1. A prescription for pain medication may be given to you upon discharge.  Take your pain medication as prescribed, if needed.  If narcotic pain medicine is not needed, then you may take acetaminophen (Tylenol) or ibuprofen (Advil) as needed. °2. Take your usually prescribed medications unless otherwise directed °3. If you need a refill on your pain medication, please contact your pharmacy.  They will contact our office to request authorization.  Prescriptions will not be filled after 5pm or on week-ends. °4. You should eat very light the first 24 hours after surgery, such as soup, crackers, pudding, etc.  Resume your normal diet the day after surgery. °5. Most patients will experience some swelling and bruising in the breast.  Ice packs and a good support bra will help.  Swelling and bruising can take several days to resolve.  °6. It is common to experience some constipation if taking pain medication after surgery.  Increasing fluid intake and taking a stool softener will usually help or prevent this problem from occurring.  A mild laxative (Milk of Magnesia or Miralax) should be taken according to package directions if there are no bowel movements after 48 hours. °7. Unless discharge instructions indicate otherwise, you may remove your bandages 24-48 hours after surgery, and you may shower at that time.  You may have steri-strips (small skin tapes) in place directly over the incision.  These strips should be left on the skin for 7-10 days.  If your surgeon used skin glue on the incision, you may shower in 24 hours.  The glue will flake off over the  next 2-3 weeks.  Any sutures or staples will be removed at the office during your follow-up visit. °8. ACTIVITIES:  You may resume regular daily activities (gradually increasing) beginning the next day.  Wearing a good support bra or sports bra minimizes pain and swelling.  You may have sexual intercourse when it is comfortable. °a. You may drive when you no longer are taking prescription pain medication, you can comfortably wear a seatbelt, and you can safely maneuver your car and apply brakes. °b. RETURN TO WORK:  ______________________________________________________________________________________ °9. You should see your doctor in the office for a follow-up appointment approximately two weeks after your surgery.  Your doctor’s nurse will typically make your follow-up appointment when she calls you with your pathology report.  Expect your pathology report 2-3 business days after your surgery.  You may call to check if you do not hear from us after three days. °10. OTHER INSTRUCTIONS: _______________________________________________________________________________________________ _____________________________________________________________________________________________________________________________________ °_____________________________________________________________________________________________________________________________________ °_____________________________________________________________________________________________________________________________________ ° °WHEN TO CALL YOUR DOCTOR: °1. Fever over 101.0 °2. Nausea and/or vomiting. °3. Extreme swelling or bruising. °4. Continued bleeding from incision. °5. Increased pain, redness, or drainage from the incision. ° °The clinic staff is available to answer your questions during regular business hours.  Please don’t hesitate to call and ask to speak to one of the nurses for clinical concerns.  If you have a medical emergency, go to the nearest  emergency room or call 911.  A surgeon from [facility] is always on call at the hospital. ° °For further questions, please visit [email or web address]  °

## 2016-08-19 ENCOUNTER — Encounter (HOSPITAL_COMMUNITY): Payer: Self-pay | Admitting: General Surgery

## 2016-08-19 ENCOUNTER — Other Ambulatory Visit: Payer: Self-pay | Admitting: *Deleted

## 2016-08-19 DIAGNOSIS — C50411 Malignant neoplasm of upper-outer quadrant of right female breast: Secondary | ICD-10-CM

## 2016-08-19 NOTE — H&P (Signed)
Subjective:     Patient ID: Jodi Mccormick is a 56 y.o. female.  HPI  Patient of Drs. Hoxworth, Kinard, and Magrinat referred for consultation breast reconstruction. Presented following screening MMG with 1.6 cm group calcifications right breast. 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 Drs. Hoxworth and Kinard have discussed breast reduction as part of procedure may aide with radiation planning. In absence of reduction may require prone positioning for radiation. 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   Four paternal aunts with breast cancer. Genetic testing result pending.  Reports she wears a 19 DDD but has been specialty fitted as a K. She has had several years of neck and back pain that she relates to breast size. She has tried tylenol NSAIDs and muscle relaxants for pain without relief. She has associated intertrigo in warm weather, unrelieved with OTC creams and powders. She has had consultation for breast reduction in past and notes she was approved for it,however could not afford her deductible.   Wt notes down 15 lbs over last few months. Notes was approved for weight loss surgery in past but similarly could not afford her out of pocket.  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 Can work from home. Accompanied by husband.  PMH includes OSA on CPAP.      Objective:   Physical Exam  Constitutional: She is oriented to person, place, and time.  Cardiovascular: Normal rate.   Pulmonary/Chest: Effort normal.  Abdominal: Soft.  Lymphadenopathy:    She has no axillary adenopathy.  Neurological: She is alert and oriented to person, place, and time.  Skin:  Fitzpatrick 6   No palpable masses    grade 3 ptosis no palpable masses SN to nipple R 52 L 52 cm BW R 20 L 30 cm Nipple to IMF R 24 L 27 cm  +shoulder  grooving  Assessment:     Right breast ca UOQ ER- Macromastia Chronic neck and back pain Morbid obesity Family history breast cancer    Plan:     Plan oncoplastic reconstruction as staged procedure with reduction 7-10 d post lumpectomy to ensure pathologic clearance.Reviewed reduction with anchor type scars, drains, post operative visits and limitations, recovery.   Discussed risks wound healing problems, asymmetry, diminished sensation breast. Discussedsmaller breast size may aid with adjuvant radiation. Discussed will have some contraction of breast volume and increased firmness with radiation, less ptosis with aging. Discussed changes with wt gain, loss, aging. If she pursues partial mastectomy, highly encourage her to pursue breast reduction prior to XRT as risk of complications post would be very high. Counseled I cannot assure her bra size.   Discussed specifically with her SN to nipple distance, she would be considered high risk for nipple loss. With her ptosis, in general would recommend free nipple grafting. This would leave NAC asensate, no stimulation Discussed risks of this procedure including failure of graft, prolonged wound healing, hypo and hyperpigmentation of graft. I have no personal experience with free nipple grafts and radiation. I anticipate she would be at same risks of graft ulceration with radiation as with traditional skin grafts. There is essentially no literature I can find on this specifically effect of radiation, including review of papers on mastectomy with free nipple grafts.  Plan reduction of breast mound with resection of NAC with plan for future reconstruction if desired.  Reviewed this plan though with its aesthetic limitations would be less surgery/recovery than post mastectomy reconstruction. She is agreeable to this plan.   [name], MD [facility] [phone], pin [phone]

## 2016-08-22 ENCOUNTER — Encounter: Payer: Self-pay | Admitting: Radiation Oncology

## 2016-08-22 NOTE — Anesthesia Postprocedure Evaluation (Signed)
Anesthesia Post Note  Patient: Jodi Mccormick  Procedure(s) Performed: Procedure(s) (LRB): RADIOACTIVE SEED GUIDED RIGHT BREAST LUMPECTOMY, RIGHT AXILLARY SENTINEL LYMPH NODE BIOPSY. (Right)     Patient location during evaluation: PACU Anesthesia Type: General Level of consciousness: awake and alert Pain management: pain level controlled Vital Signs Assessment: post-procedure vital signs reviewed and stable Respiratory status: spontaneous breathing, nonlabored ventilation, respiratory function stable and patient connected to nasal cannula oxygen Cardiovascular status: blood pressure returned to baseline and stable Postop Assessment: no signs of nausea or vomiting Anesthetic complications: no    Last Vitals:  Vitals:   08/18/16 1030 08/18/16 1045  BP: 140/84 (!) 157/90  Pulse: 61 64  Resp: 14 13  Temp: 36.7 C     Last Pain:  Vitals:   08/18/16 1045  TempSrc:   PainSc: 0-No pain                 [name] EDWARD

## 2016-08-25 ENCOUNTER — Encounter (HOSPITAL_COMMUNITY): Payer: Self-pay | Admitting: *Deleted

## 2016-08-25 MED ORDER — DEXTROSE 5 % IV SOLN
3.0000 g | INTRAVENOUS | Status: AC
  Administered 2016-08-26: 3 g via INTRAVENOUS
  Filled 2016-08-25: qty 3000

## 2016-08-25 NOTE — Progress Notes (Signed)
Mrs Jodi Mccormick denies chest pain or shortness of breath. Mrs Jodi Mccormick has sleep apnea and uses a CPAP- I instructed patient to bring mask for CPAP in.  Patient was instructed to buy [employer] at [employer] or [place].  Patient reports that she has checked and they do not have the product in the store, just online. I instructed patient to not drive all over searching, I will notify Dr Iran Planas.

## 2016-08-26 ENCOUNTER — Observation Stay (HOSPITAL_COMMUNITY)
Admission: RE | Admit: 2016-08-26 | Discharge: 2016-08-27 | Disposition: A | Discharge status: Home / Self Care | Payer: BLUE CROSS/BLUE SHIELD | Source: Ambulatory Visit | Attending: Plastic Surgery | Admitting: Plastic Surgery

## 2016-08-26 ENCOUNTER — Encounter (HOSPITAL_COMMUNITY)
Admission: RE | Disposition: A | Discharge status: Home / Self Care | Payer: Self-pay | Source: Ambulatory Visit | Attending: Plastic Surgery

## 2016-08-26 ENCOUNTER — Ambulatory Visit (HOSPITAL_COMMUNITY): Payer: BLUE CROSS/BLUE SHIELD | Admitting: Certified Registered Nurse Anesthetist

## 2016-08-26 ENCOUNTER — Encounter (HOSPITAL_COMMUNITY): Payer: Self-pay | Admitting: Orthopedic Surgery

## 2016-08-26 DIAGNOSIS — C50911 Malignant neoplasm of unspecified site of right female breast: Secondary | ICD-10-CM | POA: Diagnosis present

## 2016-08-26 DIAGNOSIS — Z6841 Body Mass Index (BMI) 40.0 and over, adult: Secondary | ICD-10-CM | POA: Diagnosis not present

## 2016-08-26 DIAGNOSIS — G4733 Obstructive sleep apnea (adult) (pediatric): Secondary | ICD-10-CM | POA: Insufficient documentation

## 2016-08-26 DIAGNOSIS — M549 Dorsalgia, unspecified: Secondary | ICD-10-CM | POA: Insufficient documentation

## 2016-08-26 DIAGNOSIS — Z9989 Dependence on other enabling machines and devices: Secondary | ICD-10-CM | POA: Insufficient documentation

## 2016-08-26 DIAGNOSIS — M542 Cervicalgia: Secondary | ICD-10-CM | POA: Diagnosis not present

## 2016-08-26 DIAGNOSIS — Z79899 Other long term (current) drug therapy: Secondary | ICD-10-CM | POA: Insufficient documentation

## 2016-08-26 DIAGNOSIS — Z791 Long term (current) use of non-steroidal anti-inflammatories (NSAID): Secondary | ICD-10-CM | POA: Insufficient documentation

## 2016-08-26 DIAGNOSIS — I10 Essential (primary) hypertension: Secondary | ICD-10-CM | POA: Diagnosis not present

## 2016-08-26 DIAGNOSIS — D0511 Intraductal carcinoma in situ of right breast: Principal | ICD-10-CM | POA: Insufficient documentation

## 2016-08-26 DIAGNOSIS — Z171 Estrogen receptor negative status [ER-]: Secondary | ICD-10-CM | POA: Diagnosis not present

## 2016-08-26 DIAGNOSIS — G8929 Other chronic pain: Secondary | ICD-10-CM | POA: Insufficient documentation

## 2016-08-26 DIAGNOSIS — L304 Erythema intertrigo: Secondary | ICD-10-CM | POA: Diagnosis not present

## 2016-08-26 DIAGNOSIS — N62 Hypertrophy of breast: Secondary | ICD-10-CM | POA: Insufficient documentation

## 2016-08-26 DIAGNOSIS — M1990 Unspecified osteoarthritis, unspecified site: Secondary | ICD-10-CM | POA: Diagnosis not present

## 2016-08-26 DIAGNOSIS — Z803 Family history of malignant neoplasm of breast: Secondary | ICD-10-CM | POA: Insufficient documentation

## 2016-08-26 HISTORY — DX: Obstructive sleep apnea (adult) (pediatric): G47.33

## 2016-08-26 HISTORY — DX: Dependence on other enabling machines and devices: Z99.89

## 2016-08-26 HISTORY — PX: BREAST REDUCTION SURGERY: SHX8

## 2016-08-26 HISTORY — PX: REDUCTION MAMMAPLASTY: SUR839

## 2016-08-26 HISTORY — DX: Gastro-esophageal reflux disease without esophagitis: K21.9

## 2016-08-26 SURGERY — MAMMOPLASTY, REDUCTION
Anesthesia: General | Site: Breast | Laterality: Bilateral

## 2016-08-26 MED ORDER — AMLODIPINE BESYLATE 5 MG PO TABS
5.0000 mg | ORAL_TABLET | Freq: Every day | ORAL | Status: DC
  Administered 2016-08-27: 5 mg via ORAL
  Filled 2016-08-26: qty 1

## 2016-08-26 MED ORDER — FENTANYL CITRATE (PF) 250 MCG/5ML IJ SOLN
INTRAMUSCULAR | Status: AC
  Filled 2016-08-26: qty 5

## 2016-08-26 MED ORDER — HYDROCODONE-ACETAMINOPHEN 5-325 MG PO TABS
1.0000 | ORAL_TABLET | ORAL | Status: DC | PRN
  Administered 2016-08-26: 1 via ORAL
  Administered 2016-08-27: 2 via ORAL
  Filled 2016-08-26: qty 2
  Filled 2016-08-26 (×2): qty 1

## 2016-08-26 MED ORDER — KETOROLAC TROMETHAMINE 30 MG/ML IJ SOLN
30.0000 mg | Freq: Three times a day (TID) | INTRAMUSCULAR | Status: AC
  Administered 2016-08-26 – 2016-08-27 (×3): 30 mg via INTRAVENOUS
  Filled 2016-08-26 (×2): qty 1

## 2016-08-26 MED ORDER — ENOXAPARIN SODIUM 40 MG/0.4ML ~~LOC~~ SOLN
40.0000 mg | SUBCUTANEOUS | Status: DC
  Administered 2016-08-27: 40 mg via SUBCUTANEOUS
  Filled 2016-08-26: qty 0.4

## 2016-08-26 MED ORDER — HYDROMORPHONE HCL 1 MG/ML IJ SOLN
INTRAMUSCULAR | Status: AC
  Filled 2016-08-26: qty 1

## 2016-08-26 MED ORDER — ONDANSETRON 4 MG PO TBDP: 4.0000 mg | ORAL_TABLET | Freq: Four times a day (QID) | ORAL | Status: DC | PRN

## 2016-08-26 MED ORDER — DEXAMETHASONE SODIUM PHOSPHATE 10 MG/ML IJ SOLN
INTRAMUSCULAR | Status: DC | PRN
  Administered 2016-08-26: 10 mg via INTRAVENOUS

## 2016-08-26 MED ORDER — PROPOFOL 10 MG/ML IV BOLUS
INTRAVENOUS | Status: DC | PRN
  Administered 2016-08-26: 50 mg via INTRAVENOUS
  Administered 2016-08-26: 150 mg via INTRAVENOUS

## 2016-08-26 MED ORDER — PROMETHAZINE HCL 25 MG/ML IJ SOLN: 6.2500 mg | INTRAMUSCULAR | Status: DC | PRN

## 2016-08-26 MED ORDER — MIDAZOLAM HCL 5 MG/5ML IJ SOLN
INTRAMUSCULAR | Status: DC | PRN
  Administered 2016-08-26 (×2): 1 mg via INTRAVENOUS

## 2016-08-26 MED ORDER — SODIUM CHLORIDE 0.9% FLUSH
INTRAVENOUS | Status: DC | PRN
  Administered 2016-08-26: 20 mL via INTRAVENOUS

## 2016-08-26 MED ORDER — SUGAMMADEX SODIUM 500 MG/5ML IV SOLN
INTRAVENOUS | Status: DC | PRN
  Administered 2016-08-26: 334.8 mg via INTRAVENOUS

## 2016-08-26 MED ORDER — LACTATED RINGERS IV SOLN
INTRAVENOUS | Status: DC
  Administered 2016-08-26: 08:00:00 via INTRAVENOUS

## 2016-08-26 MED ORDER — BUPIVACAINE LIPOSOME 1.3 % IJ SUSP
20.0000 mL | Freq: Once | INTRAMUSCULAR | Status: DC
  Filled 2016-08-26: qty 20

## 2016-08-26 MED ORDER — CHLORHEXIDINE GLUCONATE CLOTH 2 % EX PADS: 6.0000 | MEDICATED_PAD | Freq: Once | CUTANEOUS | Status: DC

## 2016-08-26 MED ORDER — HYDROCHLOROTHIAZIDE 25 MG PO TABS
25.0000 mg | ORAL_TABLET | Freq: Every day | ORAL | Status: DC
  Administered 2016-08-27: 25 mg via ORAL
  Filled 2016-08-26: qty 1

## 2016-08-26 MED ORDER — HYDROMORPHONE HCL 1 MG/ML IJ SOLN
0.2500 mg | INTRAMUSCULAR | Status: DC | PRN
  Administered 2016-08-26 (×4): 0.5 mg via INTRAVENOUS

## 2016-08-26 MED ORDER — ONDANSETRON HCL 4 MG/2ML IJ SOLN: 4.0000 mg | Freq: Four times a day (QID) | INTRAMUSCULAR | Status: DC | PRN

## 2016-08-26 MED ORDER — MIDAZOLAM HCL 2 MG/2ML IJ SOLN
INTRAMUSCULAR | Status: AC
  Filled 2016-08-26: qty 2

## 2016-08-26 MED ORDER — PROPOFOL 10 MG/ML IV BOLUS
INTRAVENOUS | Status: AC
  Filled 2016-08-26: qty 40

## 2016-08-26 MED ORDER — LABETALOL HCL 5 MG/ML IV SOLN
INTRAVENOUS | Status: DC | PRN
  Administered 2016-08-26 (×4): 5 mg via INTRAVENOUS

## 2016-08-26 MED ORDER — ONDANSETRON HCL 4 MG/2ML IJ SOLN
INTRAMUSCULAR | Status: DC | PRN
  Administered 2016-08-26: 4 mg via INTRAVENOUS

## 2016-08-26 MED ORDER — ROPINIROLE HCL 1 MG PO TABS
3.0000 mg | ORAL_TABLET | Freq: Every day | ORAL | Status: DC
  Filled 2016-08-26: qty 3

## 2016-08-26 MED ORDER — HEPARIN SODIUM (PORCINE) 5000 UNIT/ML IJ SOLN
5000.0000 [IU] | Freq: Once | INTRAMUSCULAR | Status: AC
  Administered 2016-08-26: 5000 [IU] via SUBCUTANEOUS
  Filled 2016-08-26: qty 1

## 2016-08-26 MED ORDER — GABAPENTIN 300 MG PO CAPS
300.0000 mg | ORAL_CAPSULE | Freq: Two times a day (BID) | ORAL | Status: DC
  Administered 2016-08-26 – 2016-08-27 (×2): 300 mg via ORAL
  Filled 2016-08-26 (×3): qty 1

## 2016-08-26 MED ORDER — HYDROMORPHONE HCL 1 MG/ML IJ SOLN: 0.5000 mg | INTRAMUSCULAR | Status: DC | PRN

## 2016-08-26 MED ORDER — BUPIVACAINE LIPOSOME 1.3 % IJ SUSP
INTRAMUSCULAR | Status: DC | PRN
  Administered 2016-08-26: 20 mL

## 2016-08-26 MED ORDER — SUGAMMADEX SODIUM 500 MG/5ML IV SOLN
INTRAVENOUS | Status: AC
  Filled 2016-08-26: qty 5

## 2016-08-26 MED ORDER — LOSARTAN POTASSIUM 50 MG PO TABS
100.0000 mg | ORAL_TABLET | Freq: Every day | ORAL | Status: DC
  Administered 2016-08-27: 100 mg via ORAL
  Filled 2016-08-26: qty 2

## 2016-08-26 MED ORDER — LIDOCAINE 2% (20 MG/ML) 5 ML SYRINGE
INTRAMUSCULAR | Status: AC
  Filled 2016-08-26: qty 5

## 2016-08-26 MED ORDER — DEXAMETHASONE SODIUM PHOSPHATE 10 MG/ML IJ SOLN
INTRAMUSCULAR | Status: AC
  Filled 2016-08-26: qty 1

## 2016-08-26 MED ORDER — LOSARTAN POTASSIUM-HCTZ 100-25 MG PO TABS: 1.0000 | ORAL_TABLET | Freq: Every day | ORAL | Status: DC

## 2016-08-26 MED ORDER — CELECOXIB 200 MG PO CAPS
400.0000 mg | ORAL_CAPSULE | ORAL | Status: AC
  Administered 2016-08-26: 400 mg via ORAL
  Filled 2016-08-26: qty 2

## 2016-08-26 MED ORDER — ONDANSETRON HCL 4 MG/2ML IJ SOLN
INTRAMUSCULAR | Status: AC
  Filled 2016-08-26: qty 2

## 2016-08-26 MED ORDER — FENTANYL CITRATE (PF) 100 MCG/2ML IJ SOLN
INTRAMUSCULAR | Status: DC | PRN
  Administered 2016-08-26: 100 ug via INTRAVENOUS
  Administered 2016-08-26 (×6): 50 ug via INTRAVENOUS
  Administered 2016-08-26: 25 ug via INTRAVENOUS
  Administered 2016-08-26: 100 ug via INTRAVENOUS
  Administered 2016-08-26: 50 ug via INTRAVENOUS
  Administered 2016-08-26: 25 ug via INTRAVENOUS
  Administered 2016-08-26 (×3): 50 ug via INTRAVENOUS

## 2016-08-26 MED ORDER — ACETAMINOPHEN 500 MG PO TABS
1000.0000 mg | ORAL_TABLET | ORAL | Status: AC
  Administered 2016-08-26: 1000 mg via ORAL
  Filled 2016-08-26: qty 2

## 2016-08-26 MED ORDER — 0.9 % SODIUM CHLORIDE (POUR BTL) OPTIME
TOPICAL | Status: DC | PRN
  Administered 2016-08-26: 1000 mL

## 2016-08-26 MED ORDER — KCL IN DEXTROSE-NACL 20-5-0.45 MEQ/L-%-% IV SOLN
INTRAVENOUS | Status: DC
  Administered 2016-08-26 – 2016-08-27 (×2): via INTRAVENOUS
  Filled 2016-08-26 (×2): qty 1000

## 2016-08-26 MED ORDER — KETOROLAC TROMETHAMINE 30 MG/ML IJ SOLN
INTRAMUSCULAR | Status: AC
  Filled 2016-08-26: qty 1

## 2016-08-26 MED ORDER — ROCURONIUM BROMIDE 10 MG/ML (PF) SYRINGE
PREFILLED_SYRINGE | INTRAVENOUS | Status: DC | PRN
  Administered 2016-08-26 (×3): 20 mg via INTRAVENOUS
  Administered 2016-08-26: 10 mg via INTRAVENOUS
  Administered 2016-08-26: 60 mg via INTRAVENOUS

## 2016-08-26 MED ORDER — ROCURONIUM BROMIDE 10 MG/ML (PF) SYRINGE
PREFILLED_SYRINGE | INTRAVENOUS | Status: AC
  Filled 2016-08-26: qty 5

## 2016-08-26 MED ORDER — PHENYLEPHRINE HCL 10 MG/ML IJ SOLN
INTRAVENOUS | Status: DC | PRN
  Administered 2016-08-26: 10 ug/min via INTRAVENOUS

## 2016-08-26 MED ORDER — LABETALOL HCL 5 MG/ML IV SOLN
INTRAVENOUS | Status: AC
  Filled 2016-08-26: qty 8

## 2016-08-26 MED ORDER — LIDOCAINE 2% (20 MG/ML) 5 ML SYRINGE
INTRAMUSCULAR | Status: DC | PRN
  Administered 2016-08-26: 80 mg via INTRAVENOUS

## 2016-08-26 MED ORDER — GABAPENTIN 300 MG PO CAPS
300.0000 mg | ORAL_CAPSULE | ORAL | Status: AC
  Administered 2016-08-26: 300 mg via ORAL
  Filled 2016-08-26: qty 1

## 2016-08-26 MED ORDER — HYDROMORPHONE HCL 1 MG/ML IJ SOLN
INTRAMUSCULAR | Status: AC
Start: 2016-08-26 — End: 2016-08-27
  Filled 2016-08-26: qty 1

## 2016-08-26 MED ORDER — LACTATED RINGERS IV SOLN
INTRAVENOUS | Status: DC | PRN
  Administered 2016-08-26 (×3): via INTRAVENOUS

## 2016-08-26 SURGICAL SUPPLY — 56 items
APPLIER CLIP 9.375 MED OPEN (MISCELLANEOUS) ×3
BINDER BREAST 3XL (GAUZE/BANDAGES/DRESSINGS) ×3 IMPLANT
BINDER BREAST XXLRG (GAUZE/BANDAGES/DRESSINGS) IMPLANT
BLADE SURG 10 STRL SS (BLADE) ×6 IMPLANT
BLADE SURG 15 STRL LF DISP TIS (BLADE) ×1 IMPLANT
BLADE SURG 15 STRL SS (BLADE) ×2
BNDG GAUZE ELAST 4 BULKY (GAUZE/BANDAGES/DRESSINGS) ×6 IMPLANT
CANISTER SUCTION 1200CC (MISCELLANEOUS) ×3 IMPLANT
CHLORAPREP W/TINT 26ML (MISCELLANEOUS) ×12 IMPLANT
CLIP APPLIE 9.375 MED OPEN (MISCELLANEOUS) ×1 IMPLANT
COVER SURGICAL LIGHT HANDLE (MISCELLANEOUS) ×6 IMPLANT
DECANTER SPIKE VIAL GLASS SM (MISCELLANEOUS) ×3 IMPLANT
DERMABOND ADVANCED (GAUZE/BANDAGES/DRESSINGS) ×12
DERMABOND ADVANCED .7 DNX12 (GAUZE/BANDAGES/DRESSINGS) ×6 IMPLANT
DRAIN CHANNEL 19F RND (DRAIN) ×3 IMPLANT
DRAPE HALF SHEET 40X57 (DRAPES) ×6 IMPLANT
DRAPE LAPAROSCOPIC ABDOMINAL (DRAPES) ×3 IMPLANT
DRAPE UNIVERSAL PACK (DRAPES) ×3 IMPLANT
DRSG PAD ABDOMINAL 8X10 ST (GAUZE/BANDAGES/DRESSINGS) ×9 IMPLANT
ELECT BLADE 4.0 EZ CLEAN MEGAD (MISCELLANEOUS) ×3
ELECT COATED BLADE 2.86 ST (ELECTRODE) ×3 IMPLANT
ELECT REM PT RETURN 9FT ADLT (ELECTROSURGICAL) ×3
ELECTRODE BLDE 4.0 EZ CLN MEGD (MISCELLANEOUS) ×1 IMPLANT
ELECTRODE REM PT RTRN 9FT ADLT (ELECTROSURGICAL) ×1 IMPLANT
EVACUATOR SILICONE 100CC (DRAIN) IMPLANT
GLOVE BIO SURGEON STRL SZ 6 (GLOVE) ×15 IMPLANT
GLOVE BIO SURGEON STRL SZ7.5 (GLOVE) ×6 IMPLANT
GLOVE BIOGEL PI IND STRL 7.5 (GLOVE) ×3 IMPLANT
GLOVE BIOGEL PI IND STRL 8.5 (GLOVE) ×1 IMPLANT
GLOVE BIOGEL PI INDICATOR 7.5 (GLOVE) ×6
GLOVE BIOGEL PI INDICATOR 8.5 (GLOVE) ×2
GLOVE ECLIPSE 8.0 STRL XLNG CF (GLOVE) ×12 IMPLANT
GOWN STRL REUS W/ TWL LRG LVL3 (GOWN DISPOSABLE) ×3 IMPLANT
GOWN STRL REUS W/ TWL XL LVL3 (GOWN DISPOSABLE) ×1 IMPLANT
GOWN STRL REUS W/TWL LRG LVL3 (GOWN DISPOSABLE) ×6
GOWN STRL REUS W/TWL XL LVL3 (GOWN DISPOSABLE) ×2
NEEDLE HYPO 25GX1X1/2 BEV (NEEDLE) ×3 IMPLANT
NS IRRIG 1000ML POUR BTL (IV SOLUTION) ×3 IMPLANT
PACK GENERAL/GYN (CUSTOM PROCEDURE TRAY) ×3 IMPLANT
PAD ABD 8X10 STRL (GAUZE/BANDAGES/DRESSINGS) ×3 IMPLANT
PEN SKIN MARKING BROAD (MISCELLANEOUS) ×3 IMPLANT
SPONGE LAP 18X18 X RAY DECT (DISPOSABLE) ×9 IMPLANT
STAPLER VISISTAT 35W (STAPLE) ×9 IMPLANT
SUT ETHILON 2 0 FS 18 (SUTURE) ×6 IMPLANT
SUT MNCRL AB 4-0 PS2 18 (SUTURE) ×18 IMPLANT
SUT SILK 2 0 FS (SUTURE) ×6 IMPLANT
SUT VIC AB 3-0 PS1 18 (SUTURE)
SUT VIC AB 3-0 PS1 18XBRD (SUTURE) IMPLANT
SUT VIC AB 3-0 PS2 18 (SUTURE) ×39 IMPLANT
SUT VIC AB 3-0 SH 27 (SUTURE)
SUT VIC AB 3-0 SH 27X BRD (SUTURE) IMPLANT
SUT VICRYL 4-0 PS2 18IN ABS (SUTURE) IMPLANT
SYR CONTROL 10ML LL (SYRINGE) ×3 IMPLANT
TOWEL OR 17X24 6PK STRL BLUE (TOWEL DISPOSABLE) ×6 IMPLANT
TRAY FOLEY W/METER SILVER 14FR (SET/KITS/TRAYS/PACK) ×3 IMPLANT
YANKAUER SUCT BULB TIP NO VENT (SUCTIONS) ×3 IMPLANT

## 2016-08-26 NOTE — Anesthesia Procedure Notes (Signed)
Procedure Name: Intubation Date/Time: 08/26/2016 8:34 AM Performed by: Everlean Cherry A Pre-anesthesia Checklist: Patient identified, Emergency Drugs available, Suction available and Patient being monitored Patient Re-evaluated:Patient Re-evaluated prior to induction Oxygen Delivery Method: Circle system utilized Preoxygenation: Pre-oxygenation with 100% oxygen Induction Type: IV induction Ventilation: Mask ventilation without difficulty and Oral airway inserted - appropriate to patient size Laryngoscope Size: Glidescope and 4 Grade View: Grade I Tube type: Oral Tube size: 7.5 mm Number of attempts: 1 Airway Equipment and Method: Video-laryngoscopy and Rigid stylet Placement Confirmation: ETT inserted through vocal cords under direct vision,  positive ETCO2 and breath sounds checked- equal and bilateral Secured at: 23 cm Tube secured with: Tape Dental Injury: Teeth and Oropharynx as per pre-operative assessment  Difficulty Due To: Difficulty was anticipated and Difficult Airway- due to large tongue

## 2016-08-26 NOTE — Op Note (Signed)
Operative Note   DATE OF OPERATION: 7.27.18  LOCATION: [facility]  SURGICAL DIVISION: Plastic Surgery  PREOPERATIVE DIAGNOSES:  1. Macromastia 2. Chronic neck and back pain 3.Intertrigo 4. Right breast DCIS  POSTOPERATIVE DIAGNOSES:  same  PROCEDURE:  Bilateral breast reduction  SURGEON: [name] MD MBA  ASSISTANTThedora Hinders RNFA  ANESTHESIA:  General.   EBL: 876 ml  COMPLICATIONS: None immediate.   INDICATIONS FOR PROCEDURE:  The patient, Jodi Mccormick, is a 56 y.o. female born on 14-Sep-1960, is here for staged oncoplastic breast reduction following right lumpectomy for DCIS. Patient has gigantomastia and reduction planned in part to aid with adjuvant radiation. Given that patient's degree of ptosis and sternal notch to nipple distance, plan resection of bilateral nipple areolar complexes with delayed reconstruction if desired.   FINDINGS: Left breast reduction 2759 g Right reduction 3060 g  DESCRIPTION OF PROCEDURE:  The patient's was marked in standing position in the preoperative area to mark sternal notch, chest midline, breast meridians, and anterior axillary lines. The inframammary fold was marked on anterior surface of breast by palpation. The breast was displaced across the meridian and 12 cm vertical limbs for resection marked. The patient was taken to the operating room. SCDs were placed and IV antibiotics and SQ heparin were given. Foley catheter placed The patient's operative site was prepped and draped in a sterile fashion. A time out was performed and all information was confirmed to be correct. In supine position, inframammary folds and lateral limbs for resection marked. A central breast mound was maintained bilaterally within vertical limbs. This area was de epithelialized.  I began on left breast. Amputation of lower pole breast within marked area completed including NAC completed. The skin flaps were developed to allow redraping over maintained  central breast mound. The patient was tailor tacked closed. I then directed my attention to right breast. Resection of breast within Wise pattern markings completed including NAC. Resection also included the anterior surface of lumpectomy cavity. Patient brought to upright sitting position and tailor tacked closed. Patient returned to supine position. Additional breast tissue removed from left. Bilateral breasts irrigated with saline and hemostasis obtained. 57 Fr JP drain placed within each breast cavity and secured with 2-0 nylon. Exparel infiltrated throughout bilateral breasts. Closure completed with 3-0 vicryl in dermis along inframammary fold and vertical limbs closed. Skin closure completed with 4-0 monocryl subcuticular stitch and tissue adhesive applied. Dry dressing and breast binder applied.  The patient was allowed to wake from anesthesia, extubated and taken to the recovery room in satisfactory condition.   SPECIMENS: right and left breast reduction  DRAINS: 19 Fr JP in right and left breast  [name], MD [facility] Re[facility] Surgery [phone], pin [phone]

## 2016-08-26 NOTE — Progress Notes (Signed)
Pt palced on CPAP for the night at a pressure of 10 and a full face mask. Pt tol well at this time.

## 2016-08-26 NOTE — Progress Notes (Signed)
Received pt from PACU, A&O x4, Bil breast dressings dry and intact, 2 JP drains intact and charged. Denies pain at this time.

## 2016-08-26 NOTE — Transfer of Care (Signed)
Immediate Anesthesia Transfer of Care Note  Patient: Lela Murfin  Procedure(s) Performed: Procedure(s): right oncoplastic MAMMARY REDUCTION  (BREAST) and left breast reduction for symmetry with bilateral resection of nipple aereola (Bilateral)  Patient Location: PACU  Anesthesia Type:General  Level of Consciousness: awake, alert , oriented and patient cooperative  Airway & Oxygen Therapy: Patient Spontanous Breathing and Patient connected to face mask oxygen  Post-op Assessment: Report given to RN, Post -op Vital signs reviewed and stable and Patient moving all extremities X 4  Post vital signs: Reviewed and stable  Last Vitals:  Vitals:   08/26/16 0701 08/26/16 1255  BP: (!) 158/70 (!) 178/94  Pulse: 65 80  Resp: 18 14  Temp: 37.1 C 36.5 C    Last Pain:  Vitals:   08/26/16 1255  TempSrc:   PainSc: 7          Complications: No apparent anesthesia complications

## 2016-08-26 NOTE — Interval H&P Note (Signed)
History and Physical Interval Note:  08/26/2016 6:56 AM  Jodi Mccormick  has presented today for surgery, with the diagnosis of mastromasta and right breast cancer  The various methods of treatment have been discussed with the patient and family. After consideration of risks, benefits and other options for treatment, the patient has consented to  Procedure(s): right oncoplastic MAMMARY REDUCTION  (BREAST) and left breast reduction for symmetry with bilateral resection of nipple aereola (Bilateral) as a surgical intervention .  The patient's history has been reviewed, patient examined, no change in status, stable for surgery.  I have reviewed the patient's chart and labs.  Questions were answered to the patient's satisfaction.     Lam Bjorklund

## 2016-08-26 NOTE — Anesthesia Preprocedure Evaluation (Addendum)
Anesthesia Evaluation  Patient identified by MRN, date of birth, ID band Patient awake    Reviewed: Allergy & Precautions, NPO status , Patient's Chart, lab work & pertinent test results  Airway Mallampati: II   Neck ROM: Full    Dental   Pulmonary    breath sounds clear to auscultation       Cardiovascular hypertension,  Rhythm:Regular Rate:Normal     Neuro/Psych    GI/Hepatic   Endo/Other  Morbid obesityMassively obese  Renal/GU      Musculoskeletal  (+) Arthritis ,   Abdominal (+) + obese,   Peds  Hematology   Anesthesia Other Findings   Reproductive/Obstetrics                           Anesthesia Physical Anesthesia Plan  ASA: III  Anesthesia Plan: General   Post-op Pain Management:    Induction: Intravenous  PONV Risk Score and Plan: 3 and Ondansetron, Dexamethasone, Propofol and Midazolam  Airway Management Planned: Oral ETT  Additional Equipment:   Intra-op Plan:   Post-operative Plan: Extubation in OR  Informed Consent: I have reviewed the patients History and Physical, chart, labs and discussed the procedure including the risks, benefits and alternatives for the proposed anesthesia with the patient or authorized representative who has indicated his/her understanding and acceptance.     Plan Discussed with: CRNA  Anesthesia Plan Comments:         Anesthesia Quick Evaluation

## 2016-08-27 DIAGNOSIS — D0511 Intraductal carcinoma in situ of right breast: Secondary | ICD-10-CM | POA: Diagnosis not present

## 2016-08-27 MED ORDER — HYDROCODONE-ACETAMINOPHEN 5-325 MG PO TABS: 1.0000 | ORAL_TABLET | ORAL | 0 refills | Status: DC | PRN

## 2016-08-27 NOTE — Discharge Summary (Signed)
Physician Discharge Summary  Patient ID: Jodi Mccormick MRN: [id number] DOB/AGE: 1960/08/25 56 y.o.  Admit date: 08/26/2016 Discharge date: 08/27/2016  Admission Diagnoses: Right breast cancer, macromastia, chronic neck and back pain  Discharge Diagnoses:  Active Problems:   Breast cancer, right [facility])  Discharged Condition: stable  Hospital Course: Patient did well post operatively toleration oral pain medication and ambulatory with minimal assist. Patient with OSA and utilized her CPAP overnight, no other respiratory concerns.  Treatments: surgery: bilateral breast reduction with resection NAC 7.27.18  Discharge Exam: Blood pressure 133/67, pulse 83, temperature 97.9 F (36.6 C), resp. rate 19, height 5\' 6"  (1.676 m), weight (!) 167.4 kg (369 lb), SpO2 99 %. Incision/Wound: incisions intact, some drainage around JP drains on left IMF, applied Dermabond to these areas, soft JPs serosanguinous  Disposition: 01-Home or Self Care  Discharge Instructions    Call MD for:  redness, tenderness, or signs of infection (pain, swelling, bleeding, redness, odor or green/yellow discharge around incision site)    Complete by:  As directed    Call MD for:  temperature >100.5    Complete by:  As directed    Discharge instructions    Complete by:  As directed    Ok to remove dressings and shower am 7.29.18. Soap and water ok, pat incisions dry. No creams or ointments over incisions. Do not let drains dangle in shower, attach to lanyard or similar.Strip and record drains twice daily and bring log to clinic visit.  Breast binder or soft compression bra all other times.  Ok to raise arms above shoulders for bathing and dressing.  No house yard work or exercise until cleared by MD.   Driving Restrictions    Complete by:  As directed    No driving for 1 week and then no driving if taking narcotics   Lifting restrictions    Complete by:  As directed    No lifting > 5 lbs for 2 weeks   Resume  previous diet    Complete by:  As directed      Allergies as of 08/27/2016   No Known Allergies     Medication List    TAKE these medications   amLODipine 5 MG tablet Commonly known as:  NORVASC Take 5 mg by mouth daily.   celecoxib 200 MG capsule Commonly known as:  CELEBREX Take 200 mg by mouth daily as needed for mild pain (takes only when out of meloxicam).   HYDROcodone-acetaminophen 5-325 MG tablet Commonly known as:  NORCO Take 1 tablet by mouth every 4 (four) hours as needed for moderate pain. What changed:  Another medication with the same name was added. Make sure you understand how and when to take each.   HYDROcodone-acetaminophen 5-325 MG tablet Commonly known as:  NORCO Take 1-2 tablets by mouth every 4 (four) hours as needed for moderate pain. What changed:  You were already taking a medication with the same name, and this prescription was added. Make sure you understand how and when to take each.   hydrocortisone 2.5 % cream Apply 1 application topically daily as needed (skin redness).   ketoconazole 2 % cream Commonly known as:  NIZORAL Apply 1 application topically daily as needed for irritation.   losartan-hydrochlorothiazide 100-25 MG tablet Commonly known as:  HYZAAR Take 1 tablet by mouth daily.   meloxicam 15 MG tablet Commonly known as:  MOBIC Take 15 mg by mouth daily.   rOPINIRole 3 MG tablet Commonly known as:  REQUIP Take 3 mg by mouth at bedtime.      Follow-up Information    [name], MD Follow up in 1 week(s).   Specialty:  Plastic Surgery Why:  as scheduled Contact information: [address] [place] [address] [phone]           Signed: [name] 08/27/2016, 9:34 AM

## 2016-08-29 ENCOUNTER — Encounter (HOSPITAL_COMMUNITY): Payer: Self-pay | Admitting: Plastic Surgery

## 2016-08-29 NOTE — Anesthesia Postprocedure Evaluation (Signed)
Anesthesia Post Note  Patient: Jodi Mccormick  Procedure(s) Performed: Procedure(s) (LRB): right oncoplastic MAMMARY REDUCTION  (BREAST) and left breast reduction for symmetry with bilateral resection of nipple aereola (Bilateral)     Patient location during evaluation: PACU Anesthesia Type: General Level of consciousness: awake and sedated Pain management: pain level controlled Vital Signs Assessment: post-procedure vital signs reviewed and stable Respiratory status: spontaneous breathing, nonlabored ventilation, respiratory function stable and patient connected to nasal cannula oxygen Cardiovascular status: blood pressure returned to baseline and stable Postop Assessment: no signs of nausea or vomiting Anesthetic complications: no    Last Vitals:  Vitals:   08/27/16 0217 08/27/16 0620  BP: (!) 114/59 133/67  Pulse: 66 83  Resp: 18 19  Temp: 37.2 C 36.6 C    Last Pain:  Vitals:   08/27/16 1055  TempSrc:   PainSc: Asleep                 Fenix Rorke,JAMES TERRILL

## 2016-09-07 NOTE — Progress Notes (Signed)
Location of Breast Cancer: upper-outer quadrant of right breast   Histology per Pathology Report:   08/26/16 Diagnosis 1. Breast, Mammoplasty, Left - FIBROCYSTIC CHANGES INCLUDING APOCRINE METAPLASIA - MICROGLANDULAR ADENOSIS - NO CARCINOMA IDENTIFIED 2. Breast, Mammoplasty, Right - FIBROCYSTIC CHANGES INCLUDING APOCRINE METAPLASIA - FIBROADENOMATOID NODULES WITH CALCIFICATIONS - PREVIOUS SURGICAL SITE CHANGES - NO CARCINOMA IDENTIFIED  08/18/16 Diagnosis 1. Breast, lumpectomy, Right w/seed - DUCTAL CARCINOMA IN SITU WITH CALCIFICATIONS, HIGH GRADE, SPANNING 0.9 CM. - THE SURGICAL RESECTION MARGINS ARE NEGATIVE FOR CARCINOMA. - SEE ONCOLOGY TABLE BELOW. 2. Lymph node, sentinel, biopsy, Right Axillary #1 - THERE IS NO EVIDENCE OF CARCINOMA IN 1 OF 1 LYMPH NODE (0/1). 3. Lymph node, sentinel, biopsy, Right Axillary #2 - THERE IS NO EVIDENCE OF CARCINOMA IN 1 OF 1 LYMPH NODE (0/1). 4. Lymph node, sentinel, biopsy, Right Axillary #3 - THERE IS NO EVIDENCE OF CARCINOMA IN 1 OF 1 LYMPH NODE (0/1).  07/06/16 Diagnosis Breast, right, needle core biopsy, upper outer SMALL FOCUS OF INVASIVE DUCTAL CARCINOMA, GRADE 2-3 DUCTAL CARCINOMA IN SITU WITH NECROSIS, GRADE 3  Receptor Status: ER(0%), PR (0%), Her2-neu (), Ki-()  Did patient present with symptoms (if so, please note symptoms) or was this found on screening mammography?: mammogram  Past/Anticipated interventions by surgeon, if any: 08/26/16 - oncoplastic MAMMARY REDUCTION  (BREAST) and left breast reduction for symmetry with bilateral resection of nipple aereola/notes, 08/18/16 - Procedure: RADIOACTIVE SEED GUIDED RIGHT BREAST LUMPECTOMY, RIGHT AXILLARY SENTINEL LYMPH NODE BIOPSY.;  Surgeon: Excell Seltzer, MD  Past/Anticipated interventions by medical oncology, if any: no  Lymphedema issues, if any:  no  Pain issues, if any:  yes reports mild pain in both breasts. Takes hydrocodone/acetaminophen usually once a day.  SAFETY  ISSUES:  Prior radiation? no  Pacemaker/ICD? no  Possible current pregnancy?no  Is the patient on methotrexate? no  Current Complaints / other details:  Patient reports the incision on her right breast is opening up and her left breast has a bad odor.  She saw Dr. Iran Planas on Friday and will see her again today.  BP 140/80 (BP Location: Right Wrist, Patient Position: Sitting)   Pulse 84   Temp 99.3 F (37.4 C) (Oral)   Ht _0  (1.676 m)   Wt (!) 372 lb (168.7 kg)   SpO2 99%   BMI 60.04 kg/m   Wt Readings from Last 3 Encounters:  09/12/16 (!) 372 lb (168.7 kg)  08/26/16 (!) 369 lb (167.4 kg)  08/18/16 (!) 369 lb (167.4 kg)      Jacqulyn Liner, RN 09/07/2016,1:52 PM

## 2016-09-08 ENCOUNTER — Ambulatory Visit: Payer: BLUE CROSS/BLUE SHIELD | Admitting: Oncology

## 2016-09-12 ENCOUNTER — Encounter: Payer: Self-pay | Admitting: Radiation Oncology

## 2016-09-12 ENCOUNTER — Ambulatory Visit
Admission: RE | Admit: 2016-09-12 | Discharge: 2016-09-12 | Disposition: A | Discharge status: Home / Self Care | Payer: BLUE CROSS/BLUE SHIELD | Source: Ambulatory Visit | Attending: Radiation Oncology | Admitting: Radiation Oncology

## 2016-09-12 DIAGNOSIS — Z171 Estrogen receptor negative status [ER-]: Principal | ICD-10-CM

## 2016-09-12 DIAGNOSIS — C50411 Malignant neoplasm of upper-outer quadrant of right female breast: Secondary | ICD-10-CM

## 2016-09-12 DIAGNOSIS — M549 Dorsalgia, unspecified: Secondary | ICD-10-CM | POA: Diagnosis not present

## 2016-09-12 DIAGNOSIS — G8929 Other chronic pain: Secondary | ICD-10-CM | POA: Diagnosis not present

## 2016-09-12 NOTE — Progress Notes (Signed)
Radiation Oncology         [phone] ________________________________  Name: Jodi Mccormick MRN: [id number]  Date: 09/12/2016  DOB: 07/19/1960  Re-Evaluation Visit Note  CC: Jodi Mccormick., MD  Magrinat, Virgie Dad, MD  Diagnosis:    ICD-10-CM   1. 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 She was seen in the Multidisciplinary Breast Clinic on 07/20/2016 to discuss potential treatment options to include breast conservation therapy with radiation, genetic testing, and breast reduction surgery. Genetic testing reported out on 07/26/2016 through the [facility] Hereditary Breast Cancer STAT panel that no deleterious mutations were found. The patient proceeded to undergo right breast lumpectomy with right axillary sentinel lymph node biopsy on 08/18/2016, which revealed DCIS with calcifications, high grade, spanning 0.9 cm, ER(-) / PR(-). Surgical resection margins were negative for carcinoma as well as 3 right axillary lymph nodes. She also underwent bilateral breast reduction on 08/26/2016. There was no residual carcinoma noted in the collected specimen. A summary of the patient's oncological history is detailed below.   The patient returns today to discuss potential adjuvant radiation therapy options to reduce risk of recurrence. On review of systems, the patient is positive for mild pain in both breasts, usually relieved by hydrocodone/acetaminophen.  ONCOLOGICAL HISTORY:   Malignant [place] of [place]outer quadrant of [place]   07/19/2016 Initial Diagnosis    [place]alignant [place] of upper[place]outer quadrant of right breast in [place]  [place]     07/26/2016 Genetic Testing    Patient has genetic testing done for a personal history of breast cancer, and a family history of pancreatic and breast cancer.  She had the Hereditary Breast Cancer STAT panel through [facility].  The STAT Breast cancer panel offered by [facility] includes sequencing and rearrangement analysis for the following 9 genes:  ATM, BRCA1, BRCA2, CDH1, CHEK2, PALB2, PTEN, STK11 and TP53.    Results revealed patient has the following mutation(s): Negative- No Pathogenic mutations or VUS's found in the 9 genes tested.    Patient declined reflex testing to a larger panel.                       ALLERGIES:  has No Known Allergies.  Meds: Current Outpatient Prescriptions  Medication Sig Dispense Refill  . amLODipine (NORVASC) 5 MG tablet Take 5 mg by mouth daily.  7  . celecoxib (CELEBREX) 200 MG capsule Take 200 mg by mouth daily as needed for mild pain (takes only when out of meloxicam).     .Marland KitchenHYDROcodone-acetaminophen (NORCO) 5-325 MG tablet Take 1 tablet by mouth every 4 (four) hours as needed for moderate pain. 15 tablet 0  . HYDROcodone-acetaminophen (NORCO) 5-325 MG tablet Take 1-2 tablets by mouth every 4 (four) hours as needed for moderate pain. 40 tablet 0  . hydrocortisone 2.5 % cream Apply 1 application topically daily as needed (skin redness).    .Marland Kitchenketoconazole (NIZORAL) 2 % cream Apply 1 application topically daily as needed for irritation.    .Marland Kitchenlosartan-hydrochlorothiazide (HYZAAR) 100-25 MG tablet Take 1 tablet by mouth daily.    . meloxicam (MOBIC) 15 MG tablet Take 15  mg by mouth daily.    Marland Kitchen rOPINIRole (REQUIP) 3 MG tablet Take 3 mg by mouth at bedtime.     No current facility-administered medications for this encounter.    REVIEW OF SYSTEMS: A 10+ POINT REVIEW OF SYSTEMS WAS OBTAINED including neurology, dermatology, psychiatry, cardiac, respiratory, lymph, extremities, GI, GU, musculoskeletal, constitutional, reproductive, HEENT. All  pertinent positives are noted in the HPI. All others are negative.  Physical Findings: Vitals:   09/12/16 1237  BP: 140/80  Pulse: 84  Temp: 99.3 F (37.4 C)  SpO2: 99%   General: Alert and oriented, in no acute distress. Heart: Regular in rate and rhythm with no murmurs, rubs, or gallops. Chest: Clear to auscultation bilaterally, with no rhonchi, wheezes, or rales. Neck: Neck is supple, no palpable cervical or supraclavicular lymphadenopathy. Breasts: Both breasts showed scars in the lower breasts from reduction mammoplasty. There was mild drainage but no signs of infection. The nipple areolar complexes on both sides was surgically absent. There was a dressing in the lower left breast which was not removed for the exam.   Lab Findings: Lab Results  Component Value Date   WBC 8.0 08/12/2016   HGB 11.5 (L) 08/12/2016   HCT 37.8 08/12/2016   MCV 73.7 (L) 08/12/2016   PLT 379 08/12/2016    Radiographic Findings: Nm Sentinel Node Inj-no Rpt (breast)  Result Date: 09/05/2016 There is no Radiologist interpretation  for this exam.  Mm Breast Surgical Specimen  Result Date: 08/18/2016 CLINICAL DATA:  Specimen radiograph status post right breast lumpectomy. EXAM: SPECIMEN RADIOGRAPH OF THE RIGHT BREAST COMPARISON:  Previous exam(s). FINDINGS: Status post excision of the right breast. The radioactive seed and biopsy marker clip are present, completely intact, and were marked for pathology. The calcifications of concern are seen within the sample and were also marked for pathology. The findings were communicated with the OR at 8:25 a.m. IMPRESSION: Specimen radiograph of the right breast. Electronically Signed   By: Ammie Ferrier M.D.   On: 08/18/2016 08:25   Mm Rt Radioactive Seed Loc Mammo Guide  Result Date: 08/16/2016 CLINICAL DATA:  Patient with right breast cancer scheduled for breast conservation surgery requiring preoperative radioactive seed localization. EXAM: MAMMOGRAPHIC  GUIDED RADIOACTIVE SEED LOCALIZATION OF THE RIGHT BREAST COMPARISON:  Previous exam(s). FINDINGS: Patient presents for radioactive seed localization prior to lumpectomy. I met with the patient and we discussed the procedure of seed localization including benefits and alternatives. We discussed the high likelihood of a successful procedure. We discussed the risks of the procedure including infection, bleeding, tissue injury and further surgery. We discussed the low dose of radioactivity involved in the procedure. Informed, written consent was given. The usual time-out protocol was performed immediately prior to the procedure. Using mammographic guidance, sterile technique, 1% lidocaine and an I-125 radioactive seed, the residual calcifications located 3 cm above the coil shaped biopsy clip were localized using a lateral approach. The follow-up mammogram images confirm the seed in the expected location, 1.4 cm lateral to the residual calcifications, and were marked for Dr. Excell Seltzer. Follow-up survey of the patient confirms presence of the radioactive seed. Order number of I-125 seed:  [id number]. Total activity:  7.209 millicuries  Reference Date: 08/09/2016 The patient tolerated the procedure well and was released from the [place]. She was given instructions regarding seed removal. IMPRESSION: Radioactive seed localization right breast. No apparent complications. Location of the radioactive seed relative to the residual calcifications in the right breast was discussed with Dr. Excell Seltzer on  July 17th at 1:30 p.m. Electronically Signed   By: Franki Cabot M.D.   On: 08/16/2016 13:39    Impression:  Jodi Mccormick is a 56 y.o. woman with DCIS of the right breast and small focus of invasive ductal carcinoma  , pT56mc pN0 pMX. At the time of lumpectomy there was no residual invasive tumor, but DCIS was recovered, measuring 0.9 cm and appearing to be high grade.  I recommend adjuvant radiotherapy to the right  breast in order to decrease her risk of locoregional recurrence. We discussed the risks, benefits, and side effects of radiotherapy. We discussed that radiation would take approximately 5.5 weeks to complete. We spoke about acute effects including skin irritation and fatigue as well as much less common late effects including injury to internal organs of the chest. We spoke about the latest technology that is used to minimize the risk of late effects for breast cancer patients undergoing radiotherapy. No guarantees of treatment were given. She is enthusiastic about proceeding with treatment. I look forward to participating in her care.  Plan:  We will see her back to move forward with the simulation and planning process after she has been cleared by her plastic surgery to receive radiation therapy. I anticipate starting radiotherapy soon after. Given her large breast size, the patient is not a candidate for hypofractionated radiation therapy. She will not require a boost since her surgical margins were initially clear and there was no residual tumor in the mammoplasty specimen.   ____________________________________  JBlair Promise PhD, MD  This document serves as a record of services personally performed by JGery Pray MD. It was created on his behalf by HRae Lips a trained medical scribe. The creation of this record is based on the scribe's personal observations and the provider's statements to them. This document has been checked and approved by the attending provider.

## 2016-09-12 NOTE — Progress Notes (Signed)
Please see the Nurse Progress Note in the MD Initial Consult Encounter for this patient. 

## 2016-09-15 ENCOUNTER — Other Ambulatory Visit: Payer: Self-pay | Admitting: *Deleted

## 2016-09-15 ENCOUNTER — Other Ambulatory Visit: Payer: Self-pay | Admitting: Plastic Surgery

## 2016-09-15 DIAGNOSIS — Z853 Personal history of malignant neoplasm of breast: Secondary | ICD-10-CM

## 2016-09-15 DIAGNOSIS — C50911 Malignant neoplasm of unspecified site of right female breast: Secondary | ICD-10-CM

## 2016-09-16 ENCOUNTER — Other Ambulatory Visit: Payer: Self-pay | Admitting: Plastic Surgery

## 2016-09-16 ENCOUNTER — Ambulatory Visit
Admission: RE | Admit: 2016-09-16 | Discharge: 2016-09-16 | Disposition: A | Discharge status: Home / Self Care | Payer: BLUE CROSS/BLUE SHIELD | Source: Ambulatory Visit | Attending: Plastic Surgery | Admitting: Plastic Surgery

## 2016-09-16 ENCOUNTER — Ambulatory Visit (HOSPITAL_BASED_OUTPATIENT_CLINIC_OR_DEPARTMENT_OTHER): Payer: BLUE CROSS/BLUE SHIELD | Admitting: Oncology

## 2016-09-16 ENCOUNTER — Other Ambulatory Visit: Payer: BLUE CROSS/BLUE SHIELD

## 2016-09-16 ENCOUNTER — Other Ambulatory Visit (HOSPITAL_BASED_OUTPATIENT_CLINIC_OR_DEPARTMENT_OTHER): Payer: BLUE CROSS/BLUE SHIELD

## 2016-09-16 VITALS — BP 137/59 | HR 86 | Temp 98.6°F | Resp 18 | Ht 66.0 in | Wt 365.2 lb

## 2016-09-16 DIAGNOSIS — Z853 Personal history of malignant neoplasm of breast: Secondary | ICD-10-CM

## 2016-09-16 DIAGNOSIS — Z171 Estrogen receptor negative status [ER-]: Secondary | ICD-10-CM | POA: Diagnosis not present

## 2016-09-16 DIAGNOSIS — C50411 Malignant neoplasm of upper-outer quadrant of right female breast: Secondary | ICD-10-CM | POA: Diagnosis not present

## 2016-09-16 DIAGNOSIS — C50911 Malignant neoplasm of unspecified site of right female breast: Secondary | ICD-10-CM

## 2016-09-16 LAB — COMPREHENSIVE METABOLIC PANEL
ALT: 12 U/L (ref 0–55)
AST: 9 U/L (ref 5–34)
Albumin: 2.8 g/dL — ABNORMAL LOW (ref 3.5–5.0)
Alkaline Phosphatase: 88 U/L (ref 40–150)
Anion Gap: 7 mEq/L (ref 3–11)
BUN: 18.8 mg/dL (ref 7.0–26.0)
CO2: 28 mEq/L (ref 22–29)
Calcium: 10 mg/dL (ref 8.4–10.4)
Chloride: 107 mEq/L (ref 98–109)
Creatinine: 0.8 mg/dL (ref 0.6–1.1)
EGFR: >90 mL/min/{1.73_m2} (ref >90)
Glucose: 98 mg/dl (ref 70–140)
Potassium: 4.6 mEq/L (ref 3.5–5.1)
Sodium: 142 mEq/L (ref 136–145)
Total Bilirubin: 0.25 mg/dL (ref 0.20–1.20)
Total Protein: 7.1 g/dL (ref 6.4–8.3)

## 2016-09-16 LAB — CBC WITH DIFFERENTIAL/PLATELET
BASO%: 0.2 % (ref 0.0–2.0)
Basophils Absolute: 0 10*3/uL (ref 0.0–0.1)
EOS%: 2.2 % (ref 0.0–7.0)
Eosinophils Absolute: 0.2 10*3/uL (ref 0.0–0.5)
HCT: 30.2 % — ABNORMAL LOW (ref 34.8–46.6)
HGB: 9.1 g/dL — ABNORMAL LOW (ref 11.6–15.9)
LYMPH%: 21.3 % (ref 14.0–49.7)
MCH: 22.1 pg — ABNORMAL LOW (ref 25.1–34.0)
MCHC: 30.1 g/dL — ABNORMAL LOW (ref 31.5–36.0)
MCV: 73.5 fL — ABNORMAL LOW (ref 79.5–101.0)
MONO#: 0.7 10*3/uL (ref 0.1–0.9)
MONO%: 8.1 % (ref 0.0–14.0)
NEUT#: 6.2 10*3/uL (ref 1.5–6.5)
NEUT%: 68.2 % (ref 38.4–76.8)
Platelets: 397 10*3/uL (ref 145–400)
RBC: 4.11 10*6/uL (ref 3.70–5.45)
RDW: 16.4 % — ABNORMAL HIGH (ref 11.2–14.5)
WBC: 9.1 10*3/uL (ref 3.9–10.3)
lymph#: 1.9 10*3/uL (ref 0.9–3.3)

## 2016-09-16 NOTE — Progress Notes (Signed)
[place]  [phone] [phone]     ID: Jodi Mccormick DOB: October 22, 1960  MR#: [id number]  JHE#:[id number]  Patient Care Team: Loraine Leriche., MD as PCP - General (Internal Medicine) Excell Seltzer, MD as Consulting Physician (General Surgery) Magrinat, Virgie Dad, MD as Consulting Physician (Oncology) Gery Pray, MD as Consulting Physician (Radiation Oncology) Chauncey Cruel, MD OTHER MD:  CHIEF COMPLAINT: Invasive ductal carcinoma of the right breast  CURRENT TREATMENT: Adjuvant radiation pending   BREAST CANCER HISTORY: From the original intake note:  The patient had screening mammography in [facility] suggesting a change in her right breast. She was referred to the [facility] where 07/06/2016 she underwent stereotactic biopsy of the upper-outer quadrant of the right breast, which showed ([id number]) an invasive ductal carcinoma, grade 2 or 3, in the setting of ductal carcinoma in situ, grade 3. There was not enough invasive tumor to do a prognostic panel but the noninvasive breast cancer was estrogen and progesterone receptor negative.  Her subsequent history is as detailed below  INTERVAL HISTORY: Jodi Mccormick returns today for follow-up and treatment of her estrogen receptor negative breast cancer. Since last visit here she underwent genetics testing, which found no deleterious mutations. She then underwent right lumpectomy and sentinel lymph node sampling, on 08/18/2016. 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 All 3 sentinel lymph nodes were clear. Margins were negative.  On 08/26/2016 she underwent bilateral reduction mammoplasties. There was no evidence of carcinoma on either side ([id number]).  She has since met with radiation oncology and is scheduled for simulation 09/19/2016.  REVIEW OF SYSTEMS: She did well with her surgeries in terms of pain and bleeding and is generally  satisfied with the cosmetic result that she has achieved so far. However she feels the left breast is currently "very heavy" and she has had mild dehiscence in the inframammary fold on the right and I am more important area on the left. She is currently on wide by her account sounds like Septra for this through Dr. Iran Planas. So far she is tolerating the antibiotic well. She is having close plastics follow-up for these problems. A detailed review of systems today was otherwise stable  PAST MEDICAL HISTORY: Past Medical History:  Diagnosis Date  . Anemia   . Arthritis    "knees" (08/26/2016)  . Estrogen receptor negative status (ER-)   . Family history of breast cancer 07/20/2016  . Family history of pancreatic cancer 07/20/2016  . GERD (gastroesophageal reflux disease)   . Heart murmur   . Hypertension   . Malignant neoplasm of upper-outer quadrant of right female breast ([place])   . Obesity   . OSA on CPAP     PAST SURGICAL HISTORY: Past Surgical History:  Procedure Laterality Date  . ABDOMINAL HYSTERECTOMY    . BREAST BIOPSY Right 07/06/2016; 08/18/2016  . BREAST LUMPECTOMY WITH RADIOACTIVE SEED AND SENTINEL LYMPH NODE BIOPSY Right 08/18/2016   Procedure: RADIOACTIVE SEED GUIDED RIGHT BREAST LUMPECTOMY, RIGHT AXILLARY SENTINEL LYMPH NODE BIOPSY.;  Surgeon: Excell Seltzer, MD;  Location: [place] [facility];  Service: General;  Laterality: Right;  . BREAST REDUCTION SURGERY Bilateral 08/26/2016   Procedure: right oncoplastic MAMMARY REDUCTION  (BREAST) and left breast reduction for symmetry with bilateral resection of nipple aereola;  Surgeon: [name], MD;  Location: [place];  Service: Plastics;  Laterality: Bilateral;  . BUNIONECTOMY Left   . DILATION AND CURETTAGE OF UTERUS  1983  . LAPAROSCOPIC CHOLECYSTECTOMY    .  REDUCTION MAMMAPLASTY Bilateral 08/26/2016   oncoplastic MAMMARY REDUCTION  (BREAST) and left breast reduction for symmetry with bilateral resection of nipple aereola/notes  08/26/2016  . TUBAL LIGATION  ~2000    FAMILY HISTORY Family History  Problem Relation Age of Onset  . Hypertension Mother   . Leukemia Mother 45  . Hypertension Father   . Diabetes Father   . Pancreatic cancer Father 30       died 47  . Breast cancer Paternal Aunt 34       died in 46's  . Kidney disease Maternal Grandmother   . Breast cancer Paternal Aunt 22       died in 33's  . Breast cancer Paternal Aunt 27       died in 4's  . Breast cancer Paternal Aunt 72  The patient's father died at age 48, from pancreatic cancer diagnosed 2 years before. The patient's mother is age 2 as of June 2018. The patient had 4 brothers, no sisters. 4 maternal aunts had breast cancer at various ages. The patient's father died at age  58 HISTORY:  No LMP recorded. Patient has had a hysterectomy. Menarche age 23, first live birth age 31. The patient is GX P2. She stopped having periods in 2010. 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 Her husband [name] is disabled secondary to low back problems. Daughter [name] lives in [place] and where she is an Therapist, sports. Daughter [name] also lives in [place] daily and where she works in a call center. The patient has 10 grandchildren and one on the way. She attends the [employer] church in [place]: Not in place  HEALTH MAINTENANCE: Social History  Substance Use Topics  . Smoking status: Never Smoker  . Smokeless tobacco: Never Used  . Alcohol use No     Colonoscopy: UTD/[facility]  PAP:  Bone density:   No Known Allergies  Current Outpatient Prescriptions  Medication Sig Dispense Refill  . amLODipine (NORVASC) 5 MG tablet Take 5 mg by mouth daily.  7  . celecoxib (CELEBREX) 200 MG capsule Take 200 mg by mouth daily as needed  for mild pain (takes only when out of meloxicam).     Marland Kitchen HYDROcodone-acetaminophen (NORCO) 5-325 MG tablet Take 1 tablet by mouth every 4 (four) hours as needed for moderate pain. 15 tablet 0  . HYDROcodone-acetaminophen (NORCO) 5-325 MG tablet Take 1-2 tablets by mouth every 4 (four) hours as needed for moderate pain. (Patient not taking: Reported on 09/12/2016) 40 tablet 0  . hydrocortisone 2.5 % cream Apply 1 application topically daily as needed (skin redness).    Marland Kitchen ketoconazole (NIZORAL) 2 % cream Apply 1 application topically daily as needed for irritation.    Marland Kitchen losartan-hydrochlorothiazide (HYZAAR) 100-25 MG tablet Take 1 tablet by mouth daily.    . meloxicam (MOBIC) 15 MG tablet Take 15 mg by mouth daily.    Marland Kitchen rOPINIRole (REQUIP) 3 MG tablet Take 3 mg by mouth at bedtime.     No current facility-administered medications for this visit.     OBJECTIVE: Morbidly obese African-American woman Who appears younger than stated age  70:   09/16/16 1031  BP: (!) 137/59  Pulse: 86  Resp: 18  Temp: 98.6 F (37 C)  SpO2: 100%     Body  mass index is 58.94 kg/m.   Filed Weights   09/16/16 1031  Weight: (!) 365 lb 3.2 oz (165.7 kg)       ECOG FS:1 - Symptomatic but completely ambulatory  Sclerae unicteric, EOMs intact Oropharynx clear and moist No cervical or supraclavicular adenopathy Lungs no rales or rhonchi Heart regular rate and rhythm Abd soft, nontender, positive bowel sounds MSK no focal spinal tenderness, no upper extremity lymphedema Neuro: nonfocal, well oriented, appropriate affect Breasts: Both breasts are status post reduction mammoplasty. The right in addition is status post lumpectomy. The cosmetic result is good. On the inframammary fold on the right there is a small area approximately 2 cm a very shallow erosion. On the left there is a slightly longer and deeper area in the inframammary fold. Both axillae are benign.  LAB RESULTS:  CMP     Component Value  Date/Time   NA 142 09/16/2016 1016   K 4.6 09/16/2016 1016   CL 103 08/12/2016 1013   CO2 28 09/16/2016 1016   GLUCOSE 98 09/16/2016 1016   BUN 18.8 09/16/2016 1016   CREATININE 0.8 09/16/2016 1016   CALCIUM 10.0 09/16/2016 1016   PROT 7.1 09/16/2016 1016   ALBUMIN 2.8 (L) 09/16/2016 1016   AST 9 09/16/2016 1016   ALT 12 09/16/2016 1016   ALKPHOS 88 09/16/2016 1016   BILITOT 0.25 09/16/2016 1016   GFRNONAA >60 08/12/2016 1013   GFRAA >60 08/12/2016 1013    No results found for: TOTALPROTELP, ALBUMINELP, A1GS, A2GS, BETS, BETA2SER, GAMS, MSPIKE, SPEI  No results found for: KPAFRELGTCHN, LAMBDASER, KAPLAMBRATIO  Lab Results  Component Value Date   WBC 9.1 09/16/2016   NEUTROABS 6.2 09/16/2016   HGB 9.1 (L) 09/16/2016   HCT 30.2 (L) 09/16/2016   MCV 73.5 (L) 09/16/2016   PLT 397 09/16/2016      Chemistry      Component Value Date/Time   NA 142 09/16/2016 1016   K 4.6 09/16/2016 1016   CL 103 08/12/2016 1013   CO2 28 09/16/2016 1016   BUN 18.8 09/16/2016 1016   CREATININE 0.8 09/16/2016 1016      Component Value Date/Time   CALCIUM 10.0 09/16/2016 1016   ALKPHOS 88 09/16/2016 1016   AST 9 09/16/2016 1016   ALT 12 09/16/2016 1016   BILITOT 0.25 09/16/2016 1016       No results found for: LABCA2  No components found for: FYBOFB510  No results for input(s): INR in the last 168 hours.  Urinalysis No results found for: COLORURINE, APPEARANCEUR, LABSPEC, PHURINE, GLUCOSEU, HGBUR, BILIRUBINUR, KETONESUR, PROTEINUR, UROBILINOGEN, NITRITE, LEUKOCYTESUR   STUDIES: Nm Sentinel Node Inj-no Rpt (breast)  Result Date: 09/05/2016 There is no Radiologist interpretation  for this exam.  Mm Breast Surgical Specimen  Result Date: 08/18/2016 CLINICAL DATA:  Specimen radiograph status post right breast lumpectomy. EXAM: SPECIMEN RADIOGRAPH OF THE RIGHT BREAST COMPARISON:  Previous exam(s). FINDINGS: Status post excision of the right breast. The radioactive seed and biopsy  marker clip are present, completely intact, and were marked for pathology. The calcifications of concern are seen within the sample and were also marked for pathology. The findings were communicated with the OR at 8:25 a.m. IMPRESSION: Specimen radiograph of the right breast. Electronically Signed   By: Ammie Ferrier M.D.   On: 08/18/2016 08:25    ELIGIBLE FOR AVAILABLE RESEARCH PROTOCOL: no  ASSESSMENT: 56 y.o. [place] woman status post right breast upper outer quadrant biopsy 07/06/2016 for an invasive ductal carcinoma,  grade 2, with insufficient material for a prognostic panel, in the setting of ductal carcinoma in situ, which was estrogen and progesterone receptor negative  (1) status post right lumpectomy with sentinel lymph node sampling 08/18/2016 showing ductal carcinoma in situ, 0.9 cm, with negative margins,; all 3 sentinel lymph nodes were clear.  (a) status post bilateral reduction mammoplasty 08/26/2016 with negative pathology  (2) as the invasive component was <0.5 Oncotype was not requested  (3) adjuvant radiation pending, simulation scheduled August 30  (4) genetics testing 07/26/2016  through the [facility] Hereditary Breast Cancer STAT panel found no deleterious mutations in:  ATM, BRCA1, BRCA2, CDH1, CHEK2, PALB2, PTEN, STK11 and TP53.    (a) the patient declined testing through the common hereditary cancer panel (46 genes)  PLAN: I discussed with Jodi Mccormick the final results of her surgeries. Basically she had ductal carcinoma in situ, with a very small invasive component, in her original biopsy in June. All subsequent surgeries have shown only ductal carcinoma in situ.  As far as the ductal carcinoma in situ is concerned, she understands this is not in itself life-threatening, as the cells are "trapped" in the ducts were they started. She has removed this cancer by surgery, with negative margins, and she will further reduce her risk of local recurrence with radiation, which is  likely to start in September. Because the ductal carcinoma in situ was estrogen receptor negative, she will not need any further treatment for that lesion.  I do not know if the small invasive component detected in June was T(mic) or T1a--I have asked pathology to review those slides to clarify that. Either way however she does not need chemotherapy as her risk of that very small cancer having metastasize before it was removed is going to be in the less than 5% range  She is struggling a bit after her reconstruction, and is concerned about infection in the left breast. This is being handled by plastics. Hopefully that will clear soon. Overall the cosmetic result is very good at this point.  I'm going to see her again in November, after her radiation treatments. At that time we will discuss anastrozole. This would be used for prophylaxis not treatment of course.  I gave her a copy of her pathology reports and emphasized that her prognosis is excellent.  She knows to call for any problems that may develop before her next visit here.   Chauncey Cruel, MD   09/16/2016 11:55 AM Medical Oncology and Hematology [facility] [address] [place], [place] [address] Tel. [phone]    Fax. [phone]

## 2016-09-18 ENCOUNTER — Other Ambulatory Visit: Payer: Self-pay | Admitting: Oncology

## 2016-09-18 NOTE — Progress Notes (Unsigned)
I checked with pathology and the invasive component in the original biopsy was T1(mic)

## 2016-09-21 LAB — AEROBIC/ANAEROBIC CULTURE (SURGICAL/DEEP WOUND): Culture: NO GROWTH

## 2016-09-21 LAB — AEROBIC/ANAEROBIC CULTURE W GRAM STAIN (SURGICAL/DEEP WOUND)

## 2016-09-22 ENCOUNTER — Ambulatory Visit
Admission: RE | Admit: 2016-09-22 | Discharge: 2016-09-22 | Disposition: A | Discharge status: Home / Self Care | Payer: BLUE CROSS/BLUE SHIELD | Source: Ambulatory Visit | Attending: Plastic Surgery | Admitting: Plastic Surgery

## 2016-09-22 DIAGNOSIS — Z853 Personal history of malignant neoplasm of breast: Secondary | ICD-10-CM

## 2016-09-29 ENCOUNTER — Ambulatory Visit
Admission: RE | Admit: 2016-09-29 | Discharge: 2016-09-29 | Disposition: A | Discharge status: Home / Self Care | Payer: BLUE CROSS/BLUE SHIELD | Source: Ambulatory Visit | Attending: Radiation Oncology | Admitting: Radiation Oncology

## 2016-09-29 DIAGNOSIS — Z171 Estrogen receptor negative status [ER-]: Principal | ICD-10-CM

## 2016-09-29 DIAGNOSIS — C50411 Malignant neoplasm of upper-outer quadrant of right female breast: Secondary | ICD-10-CM

## 2016-09-29 NOTE — Progress Notes (Signed)
Radiation Oncology         [phone] ________________________________  Name: Jodi Mccormick MRN: [id number]  Date: 09/29/2016  DOB: August 23, 1960  Follow-Up Visit Note  CC: Loraine Leriche., MD  Magrinat, Virgie Dad, MD    ICD-10-CM   1. Malignant neoplasm of upper-outer quadrant of right breast in female, estrogen receptor negative ([place]) C50.411    Z17.1     Diagnosis:  56 y.o. female with DCIS and small focus of invasive ductal carcinoma of the right breast, grade 2-3, ER(-) / PR(-), pT64mic pN0 pMX.  Narrative:  The presents today with right breast cancer. Her potential treatment options include breast conservation therapy with radiation, genetic testing, and breast reduction surgery. The patient underwent genetic testing on 07/26/2016 through the [facility] Hereditary Breast Cancer STAT panel and no deleterious mutations were found.The patient proceeded to undergo right breast lumpectomy with right axillary sentinel lymph node biopsy on 08/18/2016, which revealed DCIS with calcifications, high grade, spanning 0.9 cm, ER(-) / PR(-). Surgical resection margins were negative for carcinoma as well as 3 right axillary lymph nodes. She also underwent bilateral breast reduction on 08/26/2016. There was no residual carcinoma noted in the collected specimen.   The patient was scheduled for CT simulation today but due to the opening of her surgical scar in the inferior part of her right breast, CT simulation was cancelled. The patient presents today with her husband.                     ALLERGIES:  has No Known Allergies.  Meds: Current Outpatient Prescriptions  Medication Sig Dispense Refill  . amLODipine (NORVASC) 5 MG tablet Take 5 mg by mouth daily.  7  . celecoxib (CELEBREX) 200 MG capsule Take 200 mg by mouth daily as needed for mild pain (takes only when out of meloxicam).     Marland Kitchen HYDROcodone-acetaminophen (NORCO) 5-325 MG tablet Take 1 tablet by mouth every 4 (four) hours as needed  for moderate pain. 15 tablet 0  . HYDROcodone-acetaminophen (NORCO) 5-325 MG tablet Take 1-2 tablets by mouth every 4 (four) hours as needed for moderate pain. (Patient not taking: Reported on 09/12/2016) 40 tablet 0  . hydrocortisone 2.5 % cream Apply 1 application topically daily as needed (skin redness).    Marland Kitchen ketoconazole (NIZORAL) 2 % cream Apply 1 application topically daily as needed for irritation.    Marland Kitchen losartan-hydrochlorothiazide (HYZAAR) 100-25 MG tablet Take 1 tablet by mouth daily.    . meloxicam (MOBIC) 15 MG tablet Take 15 mg by mouth daily.    Marland Kitchen rOPINIRole (REQUIP) 3 MG tablet Take 3 mg by mouth at bedtime.     No current facility-administered medications for this encounter.     Physical Findings: The patient is in no acute distress. Patient is alert and oriented.  vitals were not taken for this visit..  No significant changes. General: Alert and oriented, in no acute distress. Heart: Regular in rate and rhythm with no murmurs, rubs, or gallops. Chest: Clear to auscultation bilaterally, with no rhonchi, wheezes, or rales. Neck: Neck is supple, no palpable cervical or supraclavicular lymphadenopathy. Breasts: Packing temporarily removed along inferior right breast. Surgical scar in the inferior part of her right breast has opened up and appears to be quite deep.  No signs of infection.   Lab Findings: Lab Results  Component Value Date   WBC 9.1 09/16/2016   HGB 9.1 (L) 09/16/2016   HCT 30.2 (L) 09/16/2016   MCV 73.5 (  L) 09/16/2016   PLT 397 09/16/2016    Radiographic Findings: US Breast Ltd Uni Left Inc Axilla  Result Date: 09/22/2016 CLINICAL DATA:  56 year old patient presents for follow-up after ultrasound aspiration was performed of a 6.4 x 3.7 x 5.1 cm fluid collection in the medial left breast on September 16, 2016, with 60 cc of blood tinged slightly cloudy serous fluid aspirated. The fluid was sent to microbiology, and rare white blood cells were present. No  organisms seen. No growth aerobically or an aerobically for 5 days. Today, the patient states that the lower inner quadrant of the left breast feels better after aspiration and she has no concerns about infection. She has an open surgical wound with packing in the inferior central left breast. EXAM: ULTRASOUND OF THE LEFT BREAST COMPARISON:  09/17/2015 FINDINGS: On physical exam, there is an open surgical wound in the inferior central left breast with packing. No skin thickening is seen in the lower inner quadrant of the left breast in the 7 o'clock region. Targeted ultrasound is performed, showing a fluid collection in the deeper aspect of the 7 o'clock position of the left breast at approximately 8 cm from nipple measuring 4.8 x 3.2 x 2.5 cm. There a few internal thin echogenic bands. There is no associated vascular flow within the mass or around the periphery of the mass to suggest inflammation. IMPRESSION: 4.8 x 3.2 x 2.5 cm fluid collection in the 7 o'clock position of the left breast with a few thin internal septations and no vascularity. This is felt to be a postoperative seroma given the microbiology report from the previous ultrasound-guided aspiration from a 09/16/2016 which yielded no organisms, no growth, and rare white blood cells. The patient clinically reports that this region of her left breast feels better after the initial aspiration. RECOMMENDATION: Clinical follow-up for the open surgical wound in the inferior left breast. No further intervention of the probable postoperative seroma in the 7 o'clock position of the left breast is recommended, unless clinical changes occur that require further imaging evaluation. The patient is due for bilateral diagnostic mammogram in May 2019. I have discussed the findings and recommendations with the patient. Results were also provided in writing at the conclusion of the visit. If applicable, a reminder letter will be sent to the patient regarding the next  appointment. BI-RADS CATEGORY  2: Benign. Electronically Signed   By: Curlene Dolphin M.D.   On: 09/22/2016 10:33   US Breast Ltd Uni Left Inc Axilla  Result Date: 09/16/2016 CLINICAL DATA:  56 year old female with recent left reduction mammoplasty, with draining wound, left breast pain and fever. EXAM: ULTRASOUND OF THE LEFT BREAST COMPARISON:  Previous exam(s). FINDINGS: On physical exam, surgical wound with packing from reduction mammoplasty identified. Fullness in the medial left breast noted. Targeted ultrasound is performed, showing a 6.4 x 3.7 x 5.1 cm fluid collection at the 7 o'clock position of the left breast 4 to 9 cm from the nipple. This collection lies medial to the surgical wound and does not have a definite connection to the skin. IMPRESSION: 6.4 cm fluid collection within the lower inner left breast, infection not excluded. Aspiration will be performed. RECOMMENDATION: Ultrasound-guided left breast aspiration, which will be performed today but dictated in a separate report. I have discussed the findings and recommendations with the patient. Results were also provided in writing at the conclusion of the visit. If applicable, a reminder letter will be sent to the patient regarding the next appointment. BI-RADS  CATEGORY  2: Benign. Electronically Signed   By: Margarette Canada M.D.   On: 09/16/2016 14:29   US Breast Aspiration Left  Addendum Date: 09/23/2016   ADDENDUM REPORT: 09/23/2016 08:48 ADDENDUM: Left breast aspiration yielded Gram Stain-RARE WBC PRESENT, PREDOMINANTLY PMN, NO ORGANISMS SEEN. Culture-No growth aerobically or anaerobically. The patient was notified of results in person by Dr. Curlene Dolphin on September 22, 2016. The patient reported feeling better after the aspiration and taking her antibiotics as prescribed. Dr. Curlene Dolphin performed a Left breast ultrasound on September 23, 2016 that is dictated in a separate report. The patient was instructed to have clinical follow-up for the open  surgical wound of the inferior left breast with Dr. Adonis Housekeeper at [facility]. The patient was instructed to return for her annual diagnostic mammogram in May 2019 and was informed a reminder notice would be sent regarding this appointment. Pathology results reported by Terie Purser, RN on 09/23/2016. Electronically Signed   By: Margarette Canada M.D.   On: 09/23/2016 08:48   Result Date: 09/23/2016 CLINICAL DATA:  56 year old female for aspiration of left breast collection. History of left reduction mammoplasty and draining wound. Patient is currently on antibiotics. EXAM: ULTRASOUND GUIDED LEFT BREAST ASPIRATION COMPARISON:  Previous exams. PROCEDURE: Using sterile technique, 1% lidocaine, under direct ultrasound visualization, needle aspiration of the 6.4 cm collection at the 7 o'clock position of the left breast was performed. 60 cc of blood tinged slightly cloudy serous fluid was aspirated with a portion sent to microbiology for further evaluation. A 0.7 x 0.7 x 2.5 cm residual collection noted. IMPRESSION: Ultrasound-guided aspiration of collection within the lower inner left breast with specimen sent to microbiology. 0.7 x 0.7 x 2.5 cm residual collection following aspiration noted. No apparent complications. RECOMMENDATIONS: Left breast ultrasound follow-up in 5-7 days as clinically indicated. Electronically Signed: By: Margarette Canada M.D. On: 09/16/2016 14:32    Impression:  The patient's  scar has opened up on the right side, therefore we will have to delay initiation of treatment.  Plan: The patient will meet with plastic surgeon later today and I anticipate that it will be several weeks before she will be ready to proceed with radiotherapy.   -----------------------------------  Blair Promise, PhD, MD This document serves as a record of services personally performed by Gery Pray, MD. It was created on his behalf by Valeta Harms, a trained medical scribe. The creation of this  record is based on the scribe's personal observations and the provider's statements to them. This document has been checked and approved by the attending provider.

## 2016-10-17 ENCOUNTER — Telehealth: Payer: Self-pay | Admitting: Oncology

## 2016-10-17 NOTE — Telephone Encounter (Signed)
Patient left a message saying that she is OK to start radiation next week per Dr. Iran Planas.  She would like a return call.

## 2016-10-18 NOTE — Telephone Encounter (Signed)
Called patient back and advised her that CT Sim will be calling her to set up an appointment today.

## 2016-10-20 ENCOUNTER — Ambulatory Visit
Admission: RE | Admit: 2016-10-20 | Payer: BLUE CROSS/BLUE SHIELD | Source: Ambulatory Visit | Admitting: Radiation Oncology

## 2016-10-24 ENCOUNTER — Ambulatory Visit
Admission: RE | Admit: 2016-10-24 | Discharge: 2016-10-24 | Disposition: A | Discharge status: Home / Self Care | Payer: BLUE CROSS/BLUE SHIELD | Source: Ambulatory Visit | Attending: Radiation Oncology | Admitting: Radiation Oncology

## 2016-10-24 DIAGNOSIS — Z510 Encounter for antineoplastic radiation therapy: Secondary | ICD-10-CM | POA: Diagnosis present

## 2016-10-24 DIAGNOSIS — Z171 Estrogen receptor negative status [ER-]: Secondary | ICD-10-CM | POA: Insufficient documentation

## 2016-10-24 DIAGNOSIS — C50411 Malignant neoplasm of upper-outer quadrant of right female breast: Secondary | ICD-10-CM | POA: Diagnosis present

## 2016-10-24 NOTE — Progress Notes (Signed)
  Radiation Oncology         [phone] ________________________________  Name: Jodi Mccormick MRN: [id number]  Date: 10/24/2016  DOB: [birth year]  SIMULATION AND TREATMENT PLANNING NOTE    ICD-10-CM   1. 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  Identity was confirmed.  All relevant records and images related to the planned course of therapy were reviewed.  The patient freely provided informed written consent to proceed with treatment after reviewing the details related to the planned course of therapy. The consent form was witnessed and verified by the simulation staff.  Then, the patient was set-up in a stable reproducible  supine position for radiation therapy.  CT images were obtained.  Surface markings were placed.  The CT images were loaded into the planning software.  Then the target and avoidance structures were contoured.  Treatment planning then occurred.  The radiation prescription was entered and confirmed.  Then, I designed and supervised the construction of a total of 3 medically necessary complex treatment devices.  I have requested : 3D Simulation  I have requested a DVH of the following structures: heart, lungs, lumpectomy cavity.  I have ordered:dose calc.  PLAN:  The patient will receive 50.4 Gy in 28 fractions. No boost is planned with clear margins on initial lumpectomy and mammoplasty specimen showing no evidence of malignancy. No lumpectomy cavity was noted on the patient's planning CT scan.  -----------------------------------  Blair Promise, PhD, MD  This document serves as a record of services personally performed by Gery Pray, MD. It was created on her behalf by [facility], a  trained medical scribe. The creation of this record is based on the scribe's personal observations and the provider's statements to them. This document has been checked and approved by the attending provider.

## 2016-10-27 DIAGNOSIS — Z510 Encounter for antineoplastic radiation therapy: Secondary | ICD-10-CM | POA: Diagnosis not present

## 2016-10-31 ENCOUNTER — Ambulatory Visit
Admission: RE | Admit: 2016-10-31 | Discharge: 2016-10-31 | Disposition: A | Discharge status: Home / Self Care | Payer: BLUE CROSS/BLUE SHIELD | Source: Ambulatory Visit | Attending: Radiation Oncology | Admitting: Radiation Oncology

## 2016-10-31 DIAGNOSIS — Z510 Encounter for antineoplastic radiation therapy: Secondary | ICD-10-CM | POA: Diagnosis not present

## 2016-10-31 DIAGNOSIS — Z171 Estrogen receptor negative status [ER-]: Principal | ICD-10-CM

## 2016-10-31 DIAGNOSIS — C50411 Malignant neoplasm of upper-outer quadrant of right female breast: Secondary | ICD-10-CM

## 2016-10-31 NOTE — Progress Notes (Signed)
  Radiation Oncology         [phone] ________________________________  Name: Jodi Mccormick MRN: [id number]  Date: 10/31/2016  DOB: [birth year]  Simulation Verification Note    ICD-10-CM   1. Malignant neoplasm of upper-outer quadrant of right breast in female, estrogen receptor negative ([place]) C50.411    Z17.1     Status: outpatient  NARRATIVE: The patient was brought to the treatment unit and placed in the planned treatment position. The clinical setup was verified. Then port films were obtained and uploaded to the radiation oncology medical record software.  The treatment beams were carefully compared against the planned radiation fields. The position location and shape of the radiation fields was reviewed. They targeted volume of tissue appears to be appropriately covered by the radiation beams. Organs at risk appear to be excluded as planned.  Based on my personal review, I approved the simulation verification. The patient's treatment will proceed as planned.  -----------------------------------  Blair Promise, PhD, MD

## 2016-11-01 ENCOUNTER — Ambulatory Visit
Admission: RE | Admit: 2016-11-01 | Discharge: 2016-11-01 | Disposition: A | Discharge status: Home / Self Care | Payer: BLUE CROSS/BLUE SHIELD | Source: Ambulatory Visit | Attending: Radiation Oncology | Admitting: Radiation Oncology

## 2016-11-01 DIAGNOSIS — Z510 Encounter for antineoplastic radiation therapy: Secondary | ICD-10-CM | POA: Diagnosis not present

## 2016-11-01 DIAGNOSIS — Z171 Estrogen receptor negative status [ER-]: Principal | ICD-10-CM

## 2016-11-01 DIAGNOSIS — C50411 Malignant neoplasm of upper-outer quadrant of right female breast: Secondary | ICD-10-CM

## 2016-11-01 MED ORDER — ALRA NON-METALLIC DEODORANT (RAD-ONC)
1.0000 "application " | Freq: Once | TOPICAL | Status: AC
  Administered 2016-11-01: 1 via TOPICAL

## 2016-11-01 MED ORDER — RADIAPLEXRX EX GEL
Freq: Once | CUTANEOUS | Status: AC
  Administered 2016-11-01: 15:00:00 via TOPICAL

## 2016-11-01 NOTE — Progress Notes (Signed)
Pt here for patient teaching.  Pt given Radiation and You booklet, skin care instructions, Alra deodorant and Radiaplex gel.  Reviewed areas of pertinence such as fatigue, skin changes, breast tenderness and breast swelling . Pt able to give teach back of to pat skin and use unscented/gentle soap,apply Radiaplex bid and avoid applying anything to skin within 4 hours of treatment. Pt demonstrated understanding and verbalizes understanding of information given and will contact nursing with any questions or concerns.

## 2016-11-02 ENCOUNTER — Ambulatory Visit
Admission: RE | Admit: 2016-11-02 | Discharge: 2016-11-02 | Disposition: A | Discharge status: Home / Self Care | Payer: BLUE CROSS/BLUE SHIELD | Source: Ambulatory Visit | Attending: Radiation Oncology | Admitting: Radiation Oncology

## 2016-11-02 DIAGNOSIS — Z510 Encounter for antineoplastic radiation therapy: Secondary | ICD-10-CM | POA: Diagnosis not present

## 2016-11-03 ENCOUNTER — Ambulatory Visit
Admission: RE | Admit: 2016-11-03 | Discharge: 2016-11-03 | Disposition: A | Discharge status: Home / Self Care | Payer: BLUE CROSS/BLUE SHIELD | Source: Ambulatory Visit | Attending: Radiation Oncology | Admitting: Radiation Oncology

## 2016-11-03 DIAGNOSIS — Z510 Encounter for antineoplastic radiation therapy: Secondary | ICD-10-CM | POA: Diagnosis not present

## 2016-11-04 ENCOUNTER — Ambulatory Visit
Admission: RE | Admit: 2016-11-04 | Discharge: 2016-11-04 | Disposition: A | Discharge status: Home / Self Care | Payer: BLUE CROSS/BLUE SHIELD | Source: Ambulatory Visit | Attending: Radiation Oncology | Admitting: Radiation Oncology

## 2016-11-04 DIAGNOSIS — Z510 Encounter for antineoplastic radiation therapy: Secondary | ICD-10-CM | POA: Diagnosis not present

## 2016-11-07 ENCOUNTER — Ambulatory Visit
Admission: RE | Admit: 2016-11-07 | Discharge: 2016-11-07 | Disposition: A | Discharge status: Home / Self Care | Payer: BLUE CROSS/BLUE SHIELD | Source: Ambulatory Visit | Attending: Radiation Oncology | Admitting: Radiation Oncology

## 2016-11-07 DIAGNOSIS — Z510 Encounter for antineoplastic radiation therapy: Secondary | ICD-10-CM | POA: Diagnosis not present

## 2016-11-08 ENCOUNTER — Ambulatory Visit
Admission: RE | Admit: 2016-11-08 | Discharge: 2016-11-08 | Disposition: A | Discharge status: Home / Self Care | Payer: BLUE CROSS/BLUE SHIELD | Source: Ambulatory Visit | Attending: Radiation Oncology | Admitting: Radiation Oncology

## 2016-11-08 DIAGNOSIS — Z510 Encounter for antineoplastic radiation therapy: Secondary | ICD-10-CM | POA: Diagnosis not present

## 2016-11-09 ENCOUNTER — Ambulatory Visit
Admission: RE | Admit: 2016-11-09 | Discharge: 2016-11-09 | Disposition: A | Discharge status: Home / Self Care | Payer: BLUE CROSS/BLUE SHIELD | Source: Ambulatory Visit | Attending: Radiation Oncology | Admitting: Radiation Oncology

## 2016-11-09 DIAGNOSIS — Z510 Encounter for antineoplastic radiation therapy: Secondary | ICD-10-CM | POA: Diagnosis not present

## 2016-11-10 ENCOUNTER — Ambulatory Visit
Admission: RE | Admit: 2016-11-10 | Discharge: 2016-11-10 | Disposition: A | Discharge status: Home / Self Care | Payer: BLUE CROSS/BLUE SHIELD | Source: Ambulatory Visit | Attending: Radiation Oncology | Admitting: Radiation Oncology

## 2016-11-10 DIAGNOSIS — Z510 Encounter for antineoplastic radiation therapy: Secondary | ICD-10-CM | POA: Diagnosis not present

## 2016-11-11 ENCOUNTER — Ambulatory Visit
Admission: RE | Admit: 2016-11-11 | Discharge: 2016-11-11 | Disposition: A | Discharge status: Home / Self Care | Payer: BLUE CROSS/BLUE SHIELD | Source: Ambulatory Visit | Attending: Radiation Oncology | Admitting: Radiation Oncology

## 2016-11-11 DIAGNOSIS — Z510 Encounter for antineoplastic radiation therapy: Secondary | ICD-10-CM | POA: Diagnosis not present

## 2016-11-14 ENCOUNTER — Ambulatory Visit
Admission: RE | Admit: 2016-11-14 | Discharge: 2016-11-14 | Disposition: A | Discharge status: Home / Self Care | Payer: BLUE CROSS/BLUE SHIELD | Source: Ambulatory Visit | Attending: Radiation Oncology | Admitting: Radiation Oncology

## 2016-11-14 DIAGNOSIS — Z510 Encounter for antineoplastic radiation therapy: Secondary | ICD-10-CM | POA: Diagnosis not present

## 2016-11-15 ENCOUNTER — Ambulatory Visit
Admission: RE | Admit: 2016-11-15 | Discharge: 2016-11-15 | Disposition: A | Discharge status: Home / Self Care | Payer: BLUE CROSS/BLUE SHIELD | Source: Ambulatory Visit | Attending: Radiation Oncology | Admitting: Radiation Oncology

## 2016-11-15 DIAGNOSIS — Z510 Encounter for antineoplastic radiation therapy: Secondary | ICD-10-CM | POA: Diagnosis not present

## 2016-11-16 ENCOUNTER — Ambulatory Visit
Admission: RE | Admit: 2016-11-16 | Discharge: 2016-11-16 | Disposition: A | Discharge status: Home / Self Care | Payer: BLUE CROSS/BLUE SHIELD | Source: Ambulatory Visit | Attending: Radiation Oncology | Admitting: Radiation Oncology

## 2016-11-16 DIAGNOSIS — Z510 Encounter for antineoplastic radiation therapy: Secondary | ICD-10-CM | POA: Diagnosis not present

## 2016-11-17 ENCOUNTER — Ambulatory Visit
Admission: RE | Admit: 2016-11-17 | Discharge: 2016-11-17 | Disposition: A | Discharge status: Home / Self Care | Payer: BLUE CROSS/BLUE SHIELD | Source: Ambulatory Visit | Attending: Radiation Oncology | Admitting: Radiation Oncology

## 2016-11-17 DIAGNOSIS — Z510 Encounter for antineoplastic radiation therapy: Secondary | ICD-10-CM | POA: Diagnosis not present

## 2016-11-18 ENCOUNTER — Ambulatory Visit
Admission: RE | Admit: 2016-11-18 | Discharge: 2016-11-18 | Disposition: A | Discharge status: Home / Self Care | Payer: BLUE CROSS/BLUE SHIELD | Source: Ambulatory Visit | Attending: Radiation Oncology | Admitting: Radiation Oncology

## 2016-11-18 DIAGNOSIS — Z510 Encounter for antineoplastic radiation therapy: Secondary | ICD-10-CM | POA: Diagnosis not present

## 2016-11-21 ENCOUNTER — Ambulatory Visit
Admission: RE | Admit: 2016-11-21 | Discharge: 2016-11-21 | Disposition: A | Discharge status: Home / Self Care | Payer: BLUE CROSS/BLUE SHIELD | Source: Ambulatory Visit | Attending: Radiation Oncology | Admitting: Radiation Oncology

## 2016-11-21 DIAGNOSIS — Z510 Encounter for antineoplastic radiation therapy: Secondary | ICD-10-CM | POA: Diagnosis not present

## 2016-11-22 ENCOUNTER — Ambulatory Visit
Admission: RE | Admit: 2016-11-22 | Discharge: 2016-11-22 | Disposition: A | Discharge status: Home / Self Care | Payer: BLUE CROSS/BLUE SHIELD | Source: Ambulatory Visit | Attending: Radiation Oncology | Admitting: Radiation Oncology

## 2016-11-22 DIAGNOSIS — Z510 Encounter for antineoplastic radiation therapy: Secondary | ICD-10-CM | POA: Diagnosis not present

## 2016-11-23 ENCOUNTER — Ambulatory Visit
Admission: RE | Admit: 2016-11-23 | Discharge: 2016-11-23 | Disposition: A | Discharge status: Home / Self Care | Payer: BLUE CROSS/BLUE SHIELD | Source: Ambulatory Visit | Attending: Radiation Oncology | Admitting: Radiation Oncology

## 2016-11-23 DIAGNOSIS — Z510 Encounter for antineoplastic radiation therapy: Secondary | ICD-10-CM | POA: Diagnosis not present

## 2016-11-24 ENCOUNTER — Ambulatory Visit
Admission: RE | Admit: 2016-11-24 | Discharge: 2016-11-24 | Disposition: A | Discharge status: Home / Self Care | Payer: BLUE CROSS/BLUE SHIELD | Source: Ambulatory Visit | Attending: Radiation Oncology | Admitting: Radiation Oncology

## 2016-11-24 DIAGNOSIS — Z510 Encounter for antineoplastic radiation therapy: Secondary | ICD-10-CM | POA: Diagnosis not present

## 2016-11-25 ENCOUNTER — Ambulatory Visit
Admission: RE | Admit: 2016-11-25 | Discharge: 2016-11-25 | Disposition: A | Discharge status: Home / Self Care | Payer: BLUE CROSS/BLUE SHIELD | Source: Ambulatory Visit | Attending: Radiation Oncology | Admitting: Radiation Oncology

## 2016-11-25 DIAGNOSIS — Z510 Encounter for antineoplastic radiation therapy: Secondary | ICD-10-CM | POA: Diagnosis not present

## 2016-11-28 ENCOUNTER — Ambulatory Visit
Admission: RE | Admit: 2016-11-28 | Discharge: 2016-11-28 | Disposition: A | Discharge status: Home / Self Care | Payer: BLUE CROSS/BLUE SHIELD | Source: Ambulatory Visit | Attending: Radiation Oncology | Admitting: Radiation Oncology

## 2016-11-28 DIAGNOSIS — Z510 Encounter for antineoplastic radiation therapy: Secondary | ICD-10-CM | POA: Diagnosis not present

## 2016-11-29 ENCOUNTER — Ambulatory Visit
Admission: RE | Admit: 2016-11-29 | Discharge: 2016-11-29 | Disposition: A | Discharge status: Home / Self Care | Payer: BLUE CROSS/BLUE SHIELD | Source: Ambulatory Visit | Attending: Radiation Oncology | Admitting: Radiation Oncology

## 2016-11-29 DIAGNOSIS — Z510 Encounter for antineoplastic radiation therapy: Secondary | ICD-10-CM | POA: Diagnosis not present

## 2016-11-29 DIAGNOSIS — C50411 Malignant neoplasm of upper-outer quadrant of right female breast: Secondary | ICD-10-CM

## 2016-11-29 DIAGNOSIS — Z171 Estrogen receptor negative status [ER-]: Principal | ICD-10-CM

## 2016-11-29 MED ORDER — RADIAPLEXRX EX GEL
Freq: Once | CUTANEOUS | Status: AC
  Administered 2016-11-29: 17:00:00 via TOPICAL

## 2016-11-30 ENCOUNTER — Ambulatory Visit
Admission: RE | Admit: 2016-11-30 | Discharge: 2016-11-30 | Disposition: A | Discharge status: Home / Self Care | Payer: BLUE CROSS/BLUE SHIELD | Source: Ambulatory Visit | Attending: Radiation Oncology | Admitting: Radiation Oncology

## 2016-11-30 DIAGNOSIS — Z510 Encounter for antineoplastic radiation therapy: Secondary | ICD-10-CM | POA: Diagnosis not present

## 2016-12-01 ENCOUNTER — Ambulatory Visit
Admission: RE | Admit: 2016-12-01 | Discharge: 2016-12-01 | Disposition: A | Discharge status: Home / Self Care | Payer: BLUE CROSS/BLUE SHIELD | Source: Ambulatory Visit | Attending: Radiation Oncology | Admitting: Radiation Oncology

## 2016-12-01 DIAGNOSIS — Z510 Encounter for antineoplastic radiation therapy: Secondary | ICD-10-CM | POA: Diagnosis not present

## 2016-12-02 ENCOUNTER — Ambulatory Visit
Admission: RE | Admit: 2016-12-02 | Discharge: 2016-12-02 | Disposition: A | Discharge status: Home / Self Care | Payer: BLUE CROSS/BLUE SHIELD | Source: Ambulatory Visit | Attending: Radiation Oncology | Admitting: Radiation Oncology

## 2016-12-02 DIAGNOSIS — Z510 Encounter for antineoplastic radiation therapy: Secondary | ICD-10-CM | POA: Diagnosis not present

## 2016-12-05 ENCOUNTER — Ambulatory Visit
Admission: RE | Admit: 2016-12-05 | Discharge: 2016-12-05 | Disposition: A | Discharge status: Home / Self Care | Payer: BLUE CROSS/BLUE SHIELD | Source: Ambulatory Visit | Attending: Radiation Oncology | Admitting: Radiation Oncology

## 2016-12-05 DIAGNOSIS — Z510 Encounter for antineoplastic radiation therapy: Secondary | ICD-10-CM | POA: Diagnosis not present

## 2016-12-06 ENCOUNTER — Ambulatory Visit
Admission: RE | Admit: 2016-12-06 | Discharge: 2016-12-06 | Disposition: A | Discharge status: Home / Self Care | Payer: BLUE CROSS/BLUE SHIELD | Source: Ambulatory Visit | Attending: Radiation Oncology | Admitting: Radiation Oncology

## 2016-12-06 DIAGNOSIS — Z510 Encounter for antineoplastic radiation therapy: Secondary | ICD-10-CM | POA: Diagnosis not present

## 2016-12-07 ENCOUNTER — Ambulatory Visit
Admission: RE | Admit: 2016-12-07 | Discharge: 2016-12-07 | Disposition: A | Discharge status: Home / Self Care | Payer: BLUE CROSS/BLUE SHIELD | Source: Ambulatory Visit | Attending: Radiation Oncology | Admitting: Radiation Oncology

## 2016-12-07 DIAGNOSIS — Z510 Encounter for antineoplastic radiation therapy: Secondary | ICD-10-CM | POA: Diagnosis not present

## 2016-12-08 ENCOUNTER — Ambulatory Visit
Admission: RE | Admit: 2016-12-08 | Discharge: 2016-12-08 | Disposition: A | Discharge status: Home / Self Care | Payer: BLUE CROSS/BLUE SHIELD | Source: Ambulatory Visit | Attending: Radiation Oncology | Admitting: Radiation Oncology

## 2016-12-08 DIAGNOSIS — Z510 Encounter for antineoplastic radiation therapy: Secondary | ICD-10-CM | POA: Diagnosis not present

## 2016-12-09 ENCOUNTER — Ambulatory Visit: Payer: BLUE CROSS/BLUE SHIELD

## 2016-12-12 ENCOUNTER — Ambulatory Visit: Payer: BLUE CROSS/BLUE SHIELD

## 2016-12-13 ENCOUNTER — Ambulatory Visit: Payer: BLUE CROSS/BLUE SHIELD

## 2016-12-14 ENCOUNTER — Ambulatory Visit: Payer: BLUE CROSS/BLUE SHIELD

## 2016-12-14 ENCOUNTER — Encounter: Payer: Self-pay | Admitting: Radiation Oncology

## 2016-12-14 NOTE — Progress Notes (Signed)
  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  Indication for treatment:  Curative  Radiation treatment dates: 11/01/16-12/08/16  Site/dose:   Right Breast, 50.4 Gy total delivered in 28 fractions   Beams/energy:   3D, 10X/15X  Narrative: The patient tolerated radiation treatment relatively well. Initially, the patient was asymptomatic and without acute concerns. However, as her treatment progressed she began to develop moderate fatigue, radiation-related skin changes, and mild pain within the area of her treatment field. These were all well managed and she was otherwise without complaint.   Plan: The patient has completed radiation treatment. The patient will return to radiation oncology clinic for routine followup in one month. I advised them to call or return sooner if they have any questions or concerns related to their recovery or treatment.  -----------------------------------  Blair Promise, PhD, MD  This document serves as a record of services personally performed by Gery Pray, MD. It was created on his behalf by Reola Mosher, a trained medical scribe. The creation of this record is based on the scribe's personal observations and the provider's statements to them. This document has been checked and approved by the attending provider.

## 2016-12-15 ENCOUNTER — Ambulatory Visit: Payer: BLUE CROSS/BLUE SHIELD

## 2016-12-20 ENCOUNTER — Encounter: Payer: Self-pay | Admitting: *Deleted

## 2016-12-20 NOTE — Progress Notes (Signed)
[place]  Telephone:([phone]) [phone] [phone]     ID: Jodi Mccormick DOB: 1960/10/08  MR#: [id number]  KVQ#:[id number]  Patient Care Team: Loraine Leriche., MD as PCP - General (Internal Medicine) Excell Seltzer, MD as Consulting Physician (General Surgery) Magrinat, Virgie Dad, MD as Consulting Physician (Oncology) Gery Pray, MD as Consulting Physician (Radiation Oncology) OTHER MD:  CHIEF COMPLAINT: Invasive ductal carcinoma of the right breast  CURRENT TREATMENT: To start anastrozole January 31, 2017   BREAST CANCER HISTORY: From the original intake note:  The patient had screening mammography in [name] suggesting a change in her right breast. She was referred to the [name] where 07/06/2016 she underwent stereotactic biopsy of the upper-outer quadrant of the right breast, which showed ([id number]) an invasive ductal carcinoma, grade 2 or 3, in the setting of ductal carcinoma in situ, grade 3. There was not enough invasive tumor to do a prognostic panel but the noninvasive breast cancer was estrogen and progesterone receptor negative.  Her subsequent history is as detailed below  INTERVAL HISTORY: Jodi Mccormick returns today for follow-up and treatment of her estrogen receptor negative breast cancer. She has completed radiation therapy on 12/08/2016: Right Breast, 50.4 Gy total delivered in 28 fractions. She noticed that she is feeling more fatigued after radiation treatment. She notes that she did not have pain from skin blistering. She notes that her skin healed fairly well.   REVIEW OF SYSTEMS: Breasia reports that for Thanksgiving, she stayed home and enjoyed her family. She reports being well overall.  She continued to work right through her radiation treatments.  She tries to walk about 10,000 steps a day.  She notes having some mild pain under the right breast that comes and goes. She notes that this is not a constant pain. She denies  unusual headaches, visual changes, nausea, vomiting, or dizziness. There has been no unusual cough, phlegm production, or pleurisy. This been no change in bowel or bladder habits. She denies unexplained fatigue or unexplained weight loss, bleeding, rash, or fever. A detailed review of systems was otherwise stable.   PAST MEDICAL HISTORY: Past Medical History:  Diagnosis Date  . Anemia   . Arthritis    "knees" (08/26/2016)  . Estrogen receptor negative status (ER-)   . Family history of breast cancer 07/20/2016  . Family history of pancreatic cancer 07/20/2016  . GERD (gastroesophageal reflux disease)   . Heart murmur   . Hypertension   . Malignant neoplasm of upper-outer quadrant of right female breast ([name])   . Obesity   . OSA on CPAP     PAST SURGICAL HISTORY: Past Surgical History:  Procedure Laterality Date  . ABDOMINAL HYSTERECTOMY    . BREAST BIOPSY Right 07/06/2016; 08/18/2016  . BREAST LUMPECTOMY WITH RADIOACTIVE SEED AND SENTINEL LYMPH NODE BIOPSY Right 08/18/2016   Procedure: RADIOACTIVE SEED GUIDED RIGHT BREAST LUMPECTOMY, RIGHT AXILLARY SENTINEL LYMPH NODE BIOPSY.;  Surgeon: Excell Seltzer, MD;  Location: [facility];  Service: General;  Laterality: Right;  . BREAST REDUCTION SURGERY Bilateral 08/26/2016   Procedure: right oncoplastic MAMMARY REDUCTION  (BREAST) and left breast reduction for symmetry with bilateral resection of nipple aereola;  Surgeon: [name], MD;  Location: [facility];  Service: Plastics;  Laterality: Bilateral;  . BUNIONECTOMY Left   . DILATION AND CURETTAGE OF UTERUS  1983  . LAPAROSCOPIC CHOLECYSTECTOMY    . REDUCTION MAMMAPLASTY Bilateral 08/26/2016   oncoplastic MAMMARY REDUCTION  (BREAST) and left breast reduction for symmetry with bilateral  resection of nipple aereola/notes 08/26/2016  . TUBAL LIGATION  ~2000    FAMILY HISTORY Family History  Problem Relation Age of Onset  . Hypertension Mother   . Leukemia Mother 53  . Hypertension Father     . Diabetes Father   . Pancreatic cancer Father 66       died 53  . Breast cancer Paternal Aunt 70       died in 85's  . Kidney disease Maternal Grandmother   . Breast cancer Paternal Aunt 34       died in 3's  . Breast cancer Paternal Aunt 29       died in 24's  . Breast cancer Paternal Aunt 45  The patient's father died at age 18, from pancreatic cancer diagnosed 2 years before. The patient's mother is age 53 as of June 2018. The patient had 4 brothers, no sisters. 4 maternal aunts had breast cancer at various ages. The patient's father died at age  13 HISTORY:  No LMP recorded. Patient has had a hysterectomy. Menarche age 56, first live birth age 56. The patient is GX P2. She stopped having periods in 2010. 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 Her husband [name] is disabled secondary to low back problems. Daughter Jodi Mccormick lives in [place] and where she is an Therapist, sports. Daughter Jodi Mccormick also lives in [place] and where she works in a call center. The patient has 10 grandchildren and one on the way. She attends [employer] in [place]: Not in place  HEALTH MAINTENANCE: Social History   Tobacco Use  . Smoking status: Never Smoker  . Smokeless tobacco: Never Used  Substance Use Topics  . Alcohol use: No  . Drug use: No     Colonoscopy: UTD/Peters  PAP:  Bone density:   No Known Allergies  Current Outpatient Medications  Medication Sig Dispense Refill  . amLODipine (NORVASC) 5 MG tablet Take 5 mg by mouth daily.  7  . celecoxib (CELEBREX) 200 MG capsule Take 200 mg by mouth daily as needed for mild pain (takes only when out of meloxicam).     Marland Kitchen HYDROcodone-acetaminophen (NORCO) 5-325 MG tablet Take 1 tablet by mouth every 4 (four)  hours as needed for moderate pain. 15 tablet 0  . hydrocortisone 2.5 % cream Apply 1 application topically daily as needed (skin redness).    Marland Kitchen ketoconazole (NIZORAL) 2 % cream Apply 1 application topically daily as needed for irritation.    Marland Kitchen losartan-hydrochlorothiazide (HYZAAR) 100-25 MG tablet Take 1 tablet by mouth daily.    . meloxicam (MOBIC) 15 MG tablet Take 15 mg by mouth daily.    Marland Kitchen HYDROcodone-acetaminophen (NORCO) 5-325 MG tablet Take 1-2 tablets by mouth every 4 (four) hours as needed for moderate pain. (Patient not taking: Reported on 09/12/2016) 40 tablet 0   No current facility-administered medications for this visit.     OBJECTIVE: Morbidly obese African-American woman in no acute distress  Vitals:   12/28/16 1401  BP: (!) 158/78  Pulse: 86  Resp: 18  Temp: 98.3 F (36.8 C)  SpO2: 100%     Body mass index is 60.96 kg/m.   Filed Weights   12/28/16 1401  Weight: (!) 377 lb 11.2 oz (171.3 kg)  ECOG FS:1 - Symptomatic but completely ambulatory  Sclerae unicteric, pupils round and equal Oropharynx clear and moist No cervical or supraclavicular adenopathy Lungs no rales or rhonchi Heart regular rate and rhythm Abd soft, obese, nontender, positive bowel sounds MSK no focal spinal tenderness, no upper extremity lymphedema Neuro: nonfocal, well oriented, appropriate affect Breasts: The right breast is status post reduction mammoplasty, lumpectomy, and radiation.  In the area where she hurts, inferior aspect of the breast, is exactly where the scar is cross.  This is the area that is likely to be the most traumatized by her treatment.  There is hyperpigmentation.  There is no evidence of local recurrence.  The left breast is status post reduction mammoplasty.  Is otherwise unremarkable.  Both axillae are benign.  LAB RESULTS:  CMP     Component Value Date/Time   NA 142 09/16/2016 1016   K 4.6 09/16/2016 1016   CL 103 08/12/2016 1013   CO2 28 09/16/2016 1016    GLUCOSE 98 09/16/2016 1016   BUN 18.8 09/16/2016 1016   CREATININE 0.8 09/16/2016 1016   CALCIUM 10.0 09/16/2016 1016   PROT 7.1 09/16/2016 1016   ALBUMIN 2.8 (L) 09/16/2016 1016   AST 9 09/16/2016 1016   ALT 12 09/16/2016 1016   ALKPHOS 88 09/16/2016 1016   BILITOT 0.25 09/16/2016 1016   GFRNONAA >60 08/12/2016 1013   GFRAA >60 08/12/2016 1013    No results found for: TOTALPROTELP, ALBUMINELP, A1GS, A2GS, BETS, BETA2SER, GAMS, MSPIKE, SPEI  No results found for: KPAFRELGTCHN, LAMBDASER, KAPLAMBRATIO  Lab Results  Component Value Date   WBC 9.1 09/16/2016   NEUTROABS 6.2 09/16/2016   HGB 9.1 (L) 09/16/2016   HCT 30.2 (L) 09/16/2016   MCV 73.5 (L) 09/16/2016   PLT 397 09/16/2016      Chemistry      Component Value Date/Time   NA 142 09/16/2016 1016   K 4.6 09/16/2016 1016   CL 103 08/12/2016 1013   CO2 28 09/16/2016 1016   BUN 18.8 09/16/2016 1016   CREATININE 0.8 09/16/2016 1016      Component Value Date/Time   CALCIUM 10.0 09/16/2016 1016   ALKPHOS 88 09/16/2016 1016   AST 9 09/16/2016 1016   ALT 12 09/16/2016 1016   BILITOT 0.25 09/16/2016 1016       No results found for: LABCA2  No components found for: [id number]  No results for input(s): INR in the last 168 hours.  Urinalysis No results found for: COLORURINE, APPEARANCEUR, LABSPEC, PHURINE, GLUCOSEU, HGBUR, BILIRUBINUR, KETONESUR, PROTEINUR, UROBILINOGEN, NITRITE, LEUKOCYTESUR   STUDIES: No results found.  ELIGIBLE FOR AVAILABLE RESEARCH PROTOCOL: no  ASSESSMENT: 56 y.o. [place] woman status post right breast upper outer quadrant biopsy 07/06/2016 for an invasive ductal carcinoma, grade 2, with insufficient material for a prognostic panel, in the setting of ductal carcinoma in situ, which was estrogen and progesterone receptor negative  (1) status post right lumpectomy with sentinel lymph node sampling 08/18/2016 showing ductal carcinoma in situ, 0.9 cm, with negative margins,; all 3 sentinel  lymph nodes were clear.  (a) status post bilateral reduction mammoplasty 08/26/2016 with negative pathology  (2) as the invasive component was <0.5 Oncotype was not requested  (3) adjuvant radiation completed 11/01/2016 - 12/08/2016 Site/dose:  Right Breast, 50.4 Gy total delivered in 28 fractions  (4) genetics testing 07/26/2016  through the [facility] Hereditary Breast Cancer STAT panel found no deleterious mutations in:  ATM, BRCA1, BRCA2, CDH1, CHEK2, PALB2, PTEN, STK11 and TP53.    (  a) the patient declined testing through the common hereditary cancer panel (46 genes)  PLAN: Lodie has completed treatment for her noninvasive breast cancer.  And microscopic invasive disease.  Since her prognostic panel for the noninvasive one was estrogen receptor negative (there was insufficient tissue for the invasive component) adding antiestrogens at this point would not contribute to her treatment.  However if she does take anastrozole for 5 years, she would cut and have her risk of developing another breast cancer in either breast in half.  She is interested in this.  Accordingly today we discussed the possible toxicities, side effects and complications of anastrozole.  She is willing to give it a try.  I think it would be wise to wait until January 1 before we start giving how she is still recovering from radiation.  Accordingly she will start anastrozole January 1.  She will have mammography in May.  She will see me again in June.  Assuming all is going well I will see her yearly from that point until she completes her 5 years of follow-up.  She knows to call for any problems that may develop before the next visit.  Magrinat, Virgie Dad, MD  12/28/16 2:29 PM Medical Oncology and Hematology [facility] [address] [place], [place] [address] Tel. [phone]    Fax. [phone]  This document serves as a record of services personally performed by Lurline Del, MD. It was  created on his behalf by Sheron Nightingale, a trained medical scribe. The creation of this record is based on the scribe's personal observations and the provider's statements to them.   I have reviewed the above documentation for accuracy and completeness, and I agree with the above.

## 2016-12-28 ENCOUNTER — Telehealth: Payer: Self-pay | Admitting: Oncology

## 2016-12-28 ENCOUNTER — Ambulatory Visit (HOSPITAL_BASED_OUTPATIENT_CLINIC_OR_DEPARTMENT_OTHER): Payer: BLUE CROSS/BLUE SHIELD | Admitting: Oncology

## 2016-12-28 VITALS — BP 158/78 | HR 86 | Temp 98.3°F | Resp 18 | Ht 66.0 in | Wt 377.7 lb

## 2016-12-28 DIAGNOSIS — R5383 Other fatigue: Secondary | ICD-10-CM | POA: Diagnosis not present

## 2016-12-28 DIAGNOSIS — N644 Mastodynia: Secondary | ICD-10-CM | POA: Diagnosis not present

## 2016-12-28 DIAGNOSIS — C50411 Malignant neoplasm of upper-outer quadrant of right female breast: Secondary | ICD-10-CM | POA: Diagnosis not present

## 2016-12-28 DIAGNOSIS — Z171 Estrogen receptor negative status [ER-]: Secondary | ICD-10-CM

## 2016-12-28 MED ORDER — ANASTROZOLE 1 MG PO TABS
1.0000 mg | ORAL_TABLET | Freq: Every day | ORAL | 4 refills | Status: DC
Start: 2016-12-28 — End: 2017-12-29

## 2016-12-28 NOTE — Telephone Encounter (Signed)
Gave patient AVS. Patient declined calendar of upcoming June appointments.

## 2017-06-30 ENCOUNTER — Ambulatory Visit
Admission: RE | Admit: 2017-06-30 | Discharge: 2017-06-30 | Disposition: A | Discharge status: Home / Self Care | Payer: BLUE CROSS/BLUE SHIELD | Source: Ambulatory Visit | Attending: Oncology | Admitting: Oncology

## 2017-06-30 DIAGNOSIS — C50411 Malignant neoplasm of upper-outer quadrant of right female breast: Secondary | ICD-10-CM

## 2017-06-30 DIAGNOSIS — Z171 Estrogen receptor negative status [ER-]: Principal | ICD-10-CM

## 2017-06-30 HISTORY — DX: Personal history of irradiation: Z92.3

## 2017-07-02 NOTE — Progress Notes (Signed)
[place]  [phone]) [phone] Fax:(336) [phone]     ID: Charlies Constable DOB: [birth year]  MR#: [id number]  YME#:[id number]  Patient Care Team: Loraine Leriche., MD as PCP - General (Internal Medicine) Excell Seltzer, MD as Consulting Physician (General Surgery) Zoa Dowty, Virgie Dad, MD as Consulting Physician (Oncology) Gery Pray, MD as Consulting Physician (Radiation Oncology) OTHER MD:  CHIEF COMPLAINT: Invasive ductal carcinoma of the right breast  CURRENT TREATMENT:  anastrozole    BREAST CANCER HISTORY: From the original intake note:  The patient had screening mammography in [facility] suggesting a change in her right breast. She was referred to the [facility] where 07/06/2016 she underwent stereotactic biopsy of the upper-outer quadrant of the right breast, which showed ([id number]) an invasive ductal carcinoma, grade 2 or 3, in the setting of ductal carcinoma in situ, grade 3. There was not enough invasive tumor to do a prognostic panel but the noninvasive breast cancer was estrogen and progesterone receptor negative.  Her subsequent history is as detailed below  INTERVAL HISTORY: Dawnita returns today for follow-up and treatment of her estrogen receptor negative breast cancer. She is doing well overall. She continues on anastrozole, with good tolerance. She notes that she has hot flashes that are resolving at this time. She denies vaginal dryness. She has fatigue that is different from her baseline of last year. She pays under $10/month for her anastrozole.    She had a bilateral diagnostic mammogram on 06/30/2017 at [facility] with results of: Breast density category B. Bilateral breast reduction changes and history of right breast lumpectomy in 2018. No evidence of malignancy.   REVIEW OF SYSTEMS: Adilenne reports that for exercise, she was walking and lost 40 lbs due to diet change (cutting carbs) and walking more. However, she  hasn't been able to walk much due to her foot pain. She notes that if she gets hungry, she will keep nuts, fruits, and veggies on hand. She has been working and she notes that she works year round. 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 Although, she has to have surgery on her left foot due to a torn achilles tendon and her surgeon is Dr. Linton Rump in [facility]. She denies unusual headaches, visual changes, nausea, vomiting, or dizziness. There has been no unusual cough, phlegm production, or pleurisy. This been no change in bowel or bladder habits. She denies unexplained fatigue or unexplained weight loss, bleeding, rash, or fever. A detailed review of systems was otherwise stable.     PAST MEDICAL HISTORY: Past Medical History:  Diagnosis Date  . Anemia   . Arthritis    "knees" (08/26/2016)  . Estrogen receptor negative status (ER-)   . Family history of breast cancer 07/20/2016  . Family history of pancreatic cancer 07/20/2016  . GERD (gastroesophageal reflux disease)   . Heart murmur   . Hypertension   . Malignant neoplasm of upper-outer quadrant of right female breast ([place])   . Obesity   . OSA on CPAP   . Personal history of radiation therapy     PAST SURGICAL HISTORY: Past Surgical History:  Procedure Laterality Date  . ABDOMINAL HYSTERECTOMY    . BREAST BIOPSY Right 07/06/2016; 08/18/2016  . BREAST LUMPECTOMY Right 2018  . BREAST LUMPECTOMY WITH RADIOACTIVE SEED AND SENTINEL LYMPH NODE BIOPSY Right 08/18/2016   Procedure: RADIOACTIVE SEED GUIDED RIGHT BREAST LUMPECTOMY, RIGHT AXILLARY SENTINEL LYMPH NODE BIOPSY.;  Surgeon: Excell Seltzer, MD;  Location: [place];  Service: General;  Laterality: Right;  . BREAST REDUCTION SURGERY Bilateral 08/26/2016   Procedure: right oncoplastic MAMMARY REDUCTION  (BREAST) and left breast reduction for symmetry with bilateral resection of nipple aereola;  Surgeon: [name], MD;  Location: [place];  Service: Plastics;  Laterality: Bilateral;  . BUNIONECTOMY Left   . DILATION AND CURETTAGE OF UTERUS  1983  . LAPAROSCOPIC CHOLECYSTECTOMY    . REDUCTION MAMMAPLASTY Bilateral 08/26/2016   oncoplastic MAMMARY REDUCTION  (BREAST) and left breast reduction for symmetry with bilateral resection of nipple aereola/notes 08/26/2016  . TUBAL LIGATION  ~2000    FAMILY HISTORY Family History  Problem Relation Age of Onset  . Hypertension Mother   . Leukemia Mother 71  . Hypertension Father   . Diabetes Father   . Pancreatic cancer Father 52       died 26  . Breast cancer Paternal Aunt 60       died in 30's  . Kidney disease Maternal Grandmother   . Breast cancer Paternal Aunt 74       died in 79's  . Breast cancer Paternal Aunt 103       died in 47's  . Breast cancer Paternal Aunt 21  The patient's father died at age 76, from pancreatic cancer diagnosed 2 years before. The patient's mother is age 38 as of June 2018. The patient had 4 brothers, no sisters. 4 maternal aunts had breast cancer at various ages. The patient's father died at age  83 HISTORY:  No LMP recorded. Patient has had a hysterectomy. Menarche age 69, first live birth age 54. The patient is GX P2. She stopped having periods in 2010. 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 Her husband [name] is disabled secondary to low back problems. Daughter [name] lives in [place] and where she is an Therapist, sports. Daughter [name] also lives in [place] daily and where she works in a call center. The patient has 10 grandchildren and one on the way. She attends [employer] in [place]: Not in place  HEALTH MAINTENANCE: Social History   Tobacco Use  . Smoking status: Never Smoker  . Smokeless tobacco:  Never Used  Substance Use Topics  . Alcohol use: No  . Drug use: No     Colonoscopy: UTD/[facility]  PAP:  Bone density:   No Known Allergies  Current Outpatient Medications  Medication Sig Dispense Refill  . amLODipine (NORVASC) 5 MG tablet Take 5 mg by mouth daily.  7  . anastrozole (ARIMIDEX) 1 MG tablet Take 1 tablet (1 mg total) by mouth daily. 90 tablet 4  . celecoxib (CELEBREX) 200 MG capsule Take 200 mg by mouth daily as needed for mild pain (takes only when out of meloxicam).     Marland Kitchen HYDROcodone-acetaminophen (NORCO) 5-325 MG tablet Take 1 tablet by mouth every 4 (four) hours as needed for moderate pain. 15 tablet 0  . HYDROcodone-acetaminophen (NORCO) 5-325 MG tablet Take 1-2 tablets by mouth every 4 (four) hours as needed for moderate pain. (Patient not taking: Reported on 09/12/2016) 40 tablet 0  . hydrocortisone 2.5 % cream Apply 1 application topically daily as needed (skin redness).    Marland Kitchen ketoconazole (NIZORAL) 2 % cream Apply 1 application topically  daily as needed for irritation.    Marland Kitchen losartan-hydrochlorothiazide (HYZAAR) 100-25 MG tablet Take 1 tablet by mouth daily.    . meloxicam (MOBIC) 15 MG tablet Take 15 mg by mouth daily.     No current facility-administered medications for this visit.     OBJECTIVE: Morbidly obese African-American woman who appears well  Vitals:   07/03/17 1540  BP: (!) 157/82  Pulse: 79  Resp: 17  Temp: 98.5 F (36.9 C)  SpO2: 100%     Body mass index is 55.99 kg/m.   Filed Weights   07/03/17 1540  Weight: (!) 346 lb 14.4 oz (157.4 kg)       ECOG FS:0 - Asymptomatic  Sclerae unicteric, EOMs intact Oropharynx clear and moist No cervical or supraclavicular adenopathy Lungs no rales or rhonchi Heart regular rate and rhythm Abd soft, nontender, positive bowel sounds MSK no focal spinal tenderness, no upper extremity lymphedema Neuro: nonfocal, well oriented, appropriate affect Breasts: The right breast is status post lumpectomy  and radiation as well as reduction mammoplasty.  There is no evidence of disease recurrence.  The left breast is status post reduction mammoplasty.  Both axillae are benign.  LAB RESULTS:  CMP     Component Value Date/Time   NA 142 09/16/2016 1016   K 4.6 09/16/2016 1016   CL 103 08/12/2016 1013   CO2 28 09/16/2016 1016   GLUCOSE 98 09/16/2016 1016   BUN 18.8 09/16/2016 1016   CREATININE 0.8 09/16/2016 1016   CALCIUM 10.0 09/16/2016 1016   PROT 7.1 09/16/2016 1016   ALBUMIN 2.8 (L) 09/16/2016 1016   AST 9 09/16/2016 1016   ALT 12 09/16/2016 1016   ALKPHOS 88 09/16/2016 1016   BILITOT 0.25 09/16/2016 1016   GFRNONAA >60 08/12/2016 1013   GFRAA >60 08/12/2016 1013    No results found for: TOTALPROTELP, ALBUMINELP, A1GS, A2GS, BETS, BETA2SER, GAMS, MSPIKE, SPEI  No results found for: KPAFRELGTCHN, LAMBDASER, KAPLAMBRATIO  Lab Results  Component Value Date   WBC 9.1 09/16/2016   NEUTROABS 6.2 09/16/2016   HGB 9.1 (L) 09/16/2016   HCT 30.2 (L) 09/16/2016   MCV 73.5 (L) 09/16/2016   PLT 397 09/16/2016      Chemistry      Component Value Date/Time   NA 142 09/16/2016 1016   K 4.6 09/16/2016 1016   CL 103 08/12/2016 1013   CO2 28 09/16/2016 1016   BUN 18.8 09/16/2016 1016   CREATININE 0.8 09/16/2016 1016      Component Value Date/Time   CALCIUM 10.0 09/16/2016 1016   ALKPHOS 88 09/16/2016 1016   AST 9 09/16/2016 1016   ALT 12 09/16/2016 1016   BILITOT 0.25 09/16/2016 1016       No results found for: LABCA2  No components found for: [id number]  No results for input(s): INR in the last 168 hours.  Urinalysis No results found for: COLORURINE, APPEARANCEUR, LABSPEC, PHURINE, GLUCOSEU, HGBUR, BILIRUBINUR, KETONESUR, PROTEINUR, UROBILINOGEN, NITRITE, LEUKOCYTESUR   STUDIES: Mm Diag Breast Tomo Bilateral  Result Date: 06/30/2017 CLINICAL DATA:  57 year old patient presents for first annual examination after right breast lumpectomy, performed in July 2018. She  also had reduction mammoplasty bilaterally, after her lumpectomy. EXAM: DIGITAL DIAGNOSTIC BILATERAL MAMMOGRAM WITH CAD AND TOMO COMPARISON:  Previous exam(s). ACR Breast Density Category b: There are scattered areas of fibroglandular density. FINDINGS: There are bilateral breast reduction changes. Patient's right breast cancer was in the far outer right breast. Magnification view of this region of the  right breast is negative. Additionally, magnification view was performed over a scar that was most likely related to the reduction changes. No suspicious findings are seen in either breast to suggest malignancy. Mammographic images were processed with CAD. IMPRESSION: Bilateral breast reduction changes and history of right breast lumpectomy in 2018. No evidence of malignancy. RECOMMENDATION: Diagnostic mammogram is suggested in 1 year. (Code:[id number]-01Y) I have discussed the findings and recommendations with the patient. Results were also provided in writing at the conclusion of the visit. If applicable, a reminder letter will be sent to the patient regarding the next appointment. BI-RADS CATEGORY  2: Benign. Electronically Signed   By: Curlene Dolphin M.D.   On: 06/30/2017 16:09    ELIGIBLE FOR AVAILABLE RESEARCH PROTOCOL: no  ASSESSMENT: 57 y.o. [place] woman status post right breast upper outer quadrant biopsy 07/06/2016 for an invasive ductal carcinoma, grade 2, with insufficient material for a prognostic panel, in the setting of ductal carcinoma in situ, which was estrogen and progesterone receptor negative  (1) status post right lumpectomy with sentinel lymph node sampling 08/18/2016 showing ductal carcinoma in situ, 0.9 cm, with negative margins,; all 3 sentinel lymph nodes were clear.  (a) status post bilateral reduction mammoplasty 08/26/2016 with negative pathology  (2) as the invasive component was <0.5 Oncotype was not requested  (3) adjuvant radiation completed 11/01/2016 -  12/08/2016 Site/dose:  Right Breast, 50.4 Gy total delivered in 28 fractions  (4) genetics testing 07/26/2016  through the [facility] Hereditary Breast Cancer STAT panel found no deleterious mutations in:  ATM, BRCA1, BRCA2, CDH1, CHEK2, PALB2, PTEN, STK11 and TP53.    (a) the patient declined testing through the common hereditary cancer panel (46 genes)  (5) anastrozole started January 2019  PLAN: Terrica is tolerating anastrozole well and the plan will be to continue that for a total of 5 years.  I am delighted that just by eliminating carbohydrates she has managed to lose 40 pounds.  This is very favorable and I encouraged her to continue that diet.  When she gets her left foot taking care of she can also begin an exercise program  Otherwise I am comfortable seeing her on a once a year basis.  She knows to call for any other issues that may develop before the next visit here.Jana Hakim, Virgie Dad, MD  07/03/17 4:15 PM Medical Oncology and Hematology [facility] [address] [place], [place] [address] Tel. [phone]    Fax. [phone]    I, Soijett Blue am acting as scribe for Dr. Sarajane Jews C. Deamonte Sayegh.  I, Lurline Del MD, have reviewed the above documentation for accuracy and completeness, and I agree with the above.

## 2017-07-03 ENCOUNTER — Telehealth: Payer: Self-pay | Admitting: Oncology

## 2017-07-03 ENCOUNTER — Inpatient Hospital Stay: Payer: BLUE CROSS/BLUE SHIELD | Attending: Oncology | Admitting: Oncology

## 2017-07-03 VITALS — BP 157/82 | HR 79 | Temp 98.5°F | Resp 17 | Ht 66.0 in | Wt 346.9 lb

## 2017-07-03 DIAGNOSIS — C50411 Malignant neoplasm of upper-outer quadrant of right female breast: Secondary | ICD-10-CM | POA: Diagnosis present

## 2017-07-03 DIAGNOSIS — Z8051 Family history of malignant neoplasm of kidney: Secondary | ICD-10-CM

## 2017-07-03 DIAGNOSIS — R011 Cardiac murmur, unspecified: Secondary | ICD-10-CM | POA: Diagnosis not present

## 2017-07-03 DIAGNOSIS — Z923 Personal history of irradiation: Secondary | ICD-10-CM | POA: Insufficient documentation

## 2017-07-03 DIAGNOSIS — Z79811 Long term (current) use of aromatase inhibitors: Secondary | ICD-10-CM | POA: Insufficient documentation

## 2017-07-03 DIAGNOSIS — Z170 Estrogen receptor positive status [ER+]: Secondary | ICD-10-CM | POA: Diagnosis not present

## 2017-07-03 DIAGNOSIS — R232 Flushing: Secondary | ICD-10-CM | POA: Insufficient documentation

## 2017-07-03 DIAGNOSIS — I10 Essential (primary) hypertension: Secondary | ICD-10-CM | POA: Diagnosis not present

## 2017-07-03 DIAGNOSIS — Z803 Family history of malignant neoplasm of breast: Secondary | ICD-10-CM | POA: Diagnosis not present

## 2017-07-03 DIAGNOSIS — Z6841 Body Mass Index (BMI) 40.0 and over, adult: Secondary | ICD-10-CM

## 2017-07-03 DIAGNOSIS — Z90722 Acquired absence of ovaries, bilateral: Secondary | ICD-10-CM | POA: Insufficient documentation

## 2017-07-03 DIAGNOSIS — Z79899 Other long term (current) drug therapy: Secondary | ICD-10-CM | POA: Diagnosis not present

## 2017-07-03 DIAGNOSIS — K219 Gastro-esophageal reflux disease without esophagitis: Secondary | ICD-10-CM | POA: Insufficient documentation

## 2017-07-03 DIAGNOSIS — Z800 Family history of malignant neoplasm of digestive organs: Secondary | ICD-10-CM | POA: Diagnosis not present

## 2017-07-03 DIAGNOSIS — Z90710 Acquired absence of both cervix and uterus: Secondary | ICD-10-CM | POA: Insufficient documentation

## 2017-07-03 DIAGNOSIS — Z171 Estrogen receptor negative status [ER-]: Secondary | ICD-10-CM

## 2017-07-03 DIAGNOSIS — E669 Obesity, unspecified: Secondary | ICD-10-CM | POA: Insufficient documentation

## 2017-07-03 NOTE — Telephone Encounter (Signed)
Patient declined avs and calendar  °

## 2017-12-03 ENCOUNTER — Emergency Department (HOSPITAL_BASED_OUTPATIENT_CLINIC_OR_DEPARTMENT_OTHER)
Admission: EM | Admit: 2017-12-03 | Discharge: 2017-12-03 | Disposition: A | Discharge status: Home / Self Care | Payer: BLUE CROSS/BLUE SHIELD | Attending: Emergency Medicine | Admitting: Emergency Medicine

## 2017-12-03 ENCOUNTER — Emergency Department (HOSPITAL_BASED_OUTPATIENT_CLINIC_OR_DEPARTMENT_OTHER): Payer: BLUE CROSS/BLUE SHIELD

## 2017-12-03 ENCOUNTER — Other Ambulatory Visit: Payer: Self-pay

## 2017-12-03 ENCOUNTER — Encounter (HOSPITAL_BASED_OUTPATIENT_CLINIC_OR_DEPARTMENT_OTHER): Payer: Self-pay | Admitting: Emergency Medicine

## 2017-12-03 DIAGNOSIS — I10 Essential (primary) hypertension: Secondary | ICD-10-CM | POA: Insufficient documentation

## 2017-12-03 DIAGNOSIS — E669 Obesity, unspecified: Secondary | ICD-10-CM | POA: Insufficient documentation

## 2017-12-03 DIAGNOSIS — M1712 Unilateral primary osteoarthritis, left knee: Secondary | ICD-10-CM | POA: Insufficient documentation

## 2017-12-03 DIAGNOSIS — Z79899 Other long term (current) drug therapy: Secondary | ICD-10-CM | POA: Diagnosis not present

## 2017-12-03 DIAGNOSIS — M25562 Pain in left knee: Secondary | ICD-10-CM | POA: Diagnosis present

## 2017-12-03 MED ORDER — IBUPROFEN 600 MG PO TABS: 600.0000 mg | ORAL_TABLET | Freq: Four times a day (QID) | ORAL | 0 refills | Status: DC

## 2017-12-03 MED ORDER — OXYCODONE-ACETAMINOPHEN 5-325 MG PO TABS
1.0000 | ORAL_TABLET | Freq: Once | ORAL | Status: AC
  Administered 2017-12-03: 1 via ORAL
  Filled 2017-12-03: qty 1

## 2017-12-03 MED ORDER — IBUPROFEN 400 MG PO TABS
600.0000 mg | ORAL_TABLET | Freq: Once | ORAL | Status: AC
  Administered 2017-12-03: 600 mg via ORAL
  Filled 2017-12-03: qty 1

## 2017-12-03 NOTE — Discharge Instructions (Addendum)
We signed the ER for the knee pain, and it is abundantly clear that the pain is because of worsening arthritis.  For now we recommend ice and ibuprofen around-the-clock with a close follow-up with the orthopedist. Use the crutches to reduce the amount of strain on your knee.

## 2017-12-03 NOTE — ED Triage Notes (Signed)
L knee pain since yesterday. Hx of arthritis.

## 2017-12-03 NOTE — ED Notes (Signed)
Appropriate size of knee immobilizer not available for patient. ACE wrap applied per EDP verbal instruction.

## 2017-12-03 NOTE — ED Provider Notes (Signed)
[facility] EMERGENCY DEPARTMENT Provider Note   CSN: [id number] Arrival date & time: 12/03/17  0731     History   Chief Complaint Chief Complaint  Patient presents with  . Knee Pain    HPI Jodi Mccormick is a 57 y.o. female.  HPI   57 year old female comes in with chief complaint of knee pain.  Patient has known history of arthritis of the left knee and she is awaiting weight loss before knee replacement can be offered.  Patient states that she started having the current pain yesterday.  The pain is located in the suprapatellar region, and is described as sharp pain.  Patient is having difficulty ambulating because of the pain.  Patient denies any fevers, chills, recent trauma.  Past Medical History:  Diagnosis Date  . Anemia   . Arthritis    "knees" (08/26/2016)  . Estrogen receptor negative status (ER-)   . Family history of breast cancer 07/20/2016  . Family history of pancreatic cancer 07/20/2016  . GERD (gastroesophageal reflux disease)   . Heart murmur   . Hypertension   . Malignant neoplasm of upper-outer quadrant of right female breast ([place])   . Obesity   . OSA on CPAP   . Personal history of radiation therapy     Patient Active Problem List   Diagnosis Date Noted  . Genetic testing 07/26/2016  . Family history of breast cancer 07/20/2016  . Family history of pancreatic cancer 07/20/2016  . Morbid obesity with BMI of 50.0-59.9, adult ([place]) 07/20/2016  . Malignant neoplasm of upper-outer quadrant of right breast in female, estrogen receptor negative ([place]) 07/19/2016  . Right shoulder pain 05/09/2013    Past Surgical History:  Procedure Laterality Date  . ABDOMINAL HYSTERECTOMY    . BREAST BIOPSY Right 07/06/2016; 08/18/2016  . BREAST LUMPECTOMY Right 2018  . BREAST LUMPECTOMY WITH RADIOACTIVE SEED AND SENTINEL LYMPH NODE BIOPSY Right 08/18/2016   Procedure: RADIOACTIVE SEED GUIDED RIGHT BREAST LUMPECTOMY, RIGHT AXILLARY SENTINEL LYMPH NODE  BIOPSY.;  Surgeon: Excell Seltzer, MD;  Location: [facility];  Service: General;  Laterality: Right;  . BREAST REDUCTION SURGERY Bilateral 08/26/2016   Procedure: right oncoplastic MAMMARY REDUCTION  (BREAST) and left breast reduction for symmetry with bilateral resection of nipple aereola;  Surgeon: [name], MD;  Location: [facility];  Service: Plastics;  Laterality: Bilateral;  . BUNIONECTOMY Left   . DILATION AND CURETTAGE OF UTERUS  1983  . LAPAROSCOPIC CHOLECYSTECTOMY    . REDUCTION MAMMAPLASTY Bilateral 08/26/2016   oncoplastic MAMMARY REDUCTION  (BREAST) and left breast reduction for symmetry with bilateral resection of nipple aereola/notes 08/26/2016  . TUBAL LIGATION  ~2000     OB History   None      Home Medications    Prior to Admission medications   Medication Sig Start Date End Date Taking? Authorizing Provider  amLODipine (NORVASC) 5 MG tablet Take 5 mg by mouth daily. 07/08/16   [provider]  anastrozole (ARIMIDEX) 1 MG tablet Take 1 tablet (1 mg total) by mouth daily. 12/28/16   Magrinat, Virgie Dad, MD  HYDROcodone-acetaminophen (NORCO) 5-325 MG tablet Take 1 tablet by mouth every 4 (four) hours as needed for moderate pain. 08/18/16   Excell Seltzer, MD  HYDROcodone-acetaminophen (NORCO) 5-325 MG tablet Take 1-2 tablets by mouth every 4 (four) hours as needed for moderate pain. Patient not taking: Reported on 09/12/2016 08/27/16   [name], MD  hydrocortisone 2.5 % cream Apply 1 application topically daily as needed (skin  redness).    [provider]  ibuprofen (ADVIL,MOTRIN) 600 MG tablet Take 1 tablet (600 mg total) by mouth 4 (four) times daily. 12/03/17   Varney Biles, MD  ketoconazole (NIZORAL) 2 % cream Apply 1 application topically daily as needed for irritation.    [provider]  losartan-hydrochlorothiazide (HYZAAR) 100-25 MG tablet Take 1 tablet by mouth daily.    [provider]    Family History Family  History  Problem Relation Age of Onset  . Hypertension Mother   . Leukemia Mother 52  . Hypertension Father   . Diabetes Father   . Pancreatic cancer Father 90       died 84  . Breast cancer Paternal Aunt 27       died in 78's  . Kidney disease Maternal Grandmother   . Breast cancer Paternal Aunt 45       died in 76's  . Breast cancer Paternal Aunt 71       died in 54's  . Breast cancer Paternal Aunt 37    Social History Social History   Tobacco Use  . Smoking status: Never Smoker  . Smokeless tobacco: Never Used  Substance Use Topics  . Alcohol use: No  . Drug use: No     Allergies   Patient has no known allergies.   Review of Systems Review of Systems  Constitutional: Positive for activity change.  Musculoskeletal: Positive for arthralgias.  Skin: Negative for wound.  Allergic/Immunologic: Negative for immunocompromised state.  Hematological: Does not bruise/bleed easily.     Physical Exam Updated Vital Signs BP (!) 160/82 (BP Location: Right Arm)   Pulse (!) 58   Temp 98.9 F (37.2 C) (Oral)   Resp 16   Ht 5\' 6"  (1.676 m)   Wt (!) 158.8 kg   SpO2 100%   BMI 56.49 kg/m   Physical Exam  Constitutional: She appears well-developed.  HENT:  Head: Atraumatic.  Eyes: EOM are normal.  Neck: Neck supple.  Cardiovascular: Normal rate.  Pulmonary/Chest: Effort normal.  Abdominal: Bowel sounds are normal.  Musculoskeletal: She exhibits edema and tenderness. She exhibits no deformity.  Left knee is edematous without any rubor or calor. Patient is able to flex her knee.  There is no obvious laxity appreciated.  Tenderness to palpation over the suprapatellar region  Neurological: She is alert.  Nursing note and vitals reviewed.    ED Treatments / Results  Labs (all labs ordered are listed, but only abnormal results are displayed) Labs Reviewed - No data to display  EKG None  Radiology Dg Knee Complete 4 Views Left  Result Date:  12/03/2017 CLINICAL DATA:  Left lateral knee pain radiating to medial calf, NKI, hx arthritis, states she needs a knee replacement, hx injections, recent left foot surgery in June 2019Pain EXAM: LEFT KNEE - COMPLETE 4+ VIEW COMPARISON:  Radiograph 05/05/2015 FINDINGS: No fracture of the proximal tibia or distal femur. Patella is normal. No joint effusion. There is severe narrowing of the medial joint space which is advanced compared to prior. There is osteophytosis of the medial compartment. There is osteophytosis of the patellofemoral compartment. IMPRESSION: 1. No acute findings in the knee. 2. Progressive osteoarthritis most severe in the medial compartment. Electronically Signed   By: Suzy Bouchard M.D.   On: 12/03/2017 08:59    Procedures Procedures (including critical care time)  Medications Ordered in ED Medications  ibuprofen (ADVIL,MOTRIN) tablet 600 mg (600 mg Oral Given 12/03/17 0856)  oxyCODONE-acetaminophen (PERCOCET/ROXICET)  5-325 MG per tablet 1 tablet (1 tablet Oral Given 12/03/17 0856)     Initial Impression / Assessment and Plan / ED Course  I have reviewed the triage vital signs and the nursing notes.  Pertinent labs & imaging results that were available during my care of the patient were reviewed by me and considered in my medical decision making (see chart for details).     57 year old female comes in with chief complaint of knee pain.  It seems like she has worsening of her osteoarthritis.  X-ray ordered, does not reveal any fractures. Patient has been advised to use crutches to partially bear the weight, elevate the leg and ice.  We will place a knee sleeve or Ace wrap around the knee, as her leg will not fit the immobilizer.  Final Clinical Impressions(s) / ED Diagnoses   Final diagnoses:  Arthritis of left knee    ED Discharge Orders         Ordered    ibuprofen (ADVIL,MOTRIN) 600 MG tablet  4 times daily     12/03/17 0919           Varney Biles,  MD 12/03/17 1029

## 2017-12-05 ENCOUNTER — Encounter: Payer: Self-pay | Admitting: Family Medicine

## 2017-12-05 ENCOUNTER — Ambulatory Visit: Payer: BLUE CROSS/BLUE SHIELD | Admitting: Family Medicine

## 2017-12-05 DIAGNOSIS — M25562 Pain in left knee: Secondary | ICD-10-CM | POA: Diagnosis not present

## 2017-12-05 MED ORDER — METHYLPREDNISOLONE ACETATE 40 MG/ML IJ SUSP
40.0000 mg | Freq: Once | INTRAMUSCULAR | Status: AC
  Administered 2017-12-05: 40 mg via INTRA_ARTICULAR

## 2017-12-05 NOTE — Patient Instructions (Addendum)
Your pain is due to arthritis. These are the different medications you can take for this: Tylenol 500mg  1-2 tabs three times a day for pain. Capsaicin, aspercreme, or biofreeze topically up to four times a day may also help with pain. Some supplements that may help for arthritis: Boswellia extract, curcumin, pycnogenol Ibuprofen 600mg  three times a day with food for pain and inflammation as needed. Cortisone injections are an option - you were given this today. If cortisone injections do not help, there are different types of shots that may help but they take longer to take effect (the gel shot you had in August). It's important that you continue to stay active. Straight leg raises, knee extensions 3 sets of 10 once a day (add ankle weight if these become too easy). Consider physical therapy to strengthen muscles around the joint that hurts to take pressure off of the joint itself. Shoe inserts with good arch support may be helpful. Consider medial unloader knee brace. Heat or ice 15 minutes at a time 3-4 times a day as needed to help with pain. Water aerobics and recumbent bike with low resistance are the best two types of exercise for arthritis though any exercise is ok as long as it doesn't worsen the pain. We will refer you to a dietitian in [place] to optimize your diet. Follow up with me in 1 month.

## 2017-12-05 NOTE — Progress Notes (Signed)
PCP: Loraine Leriche., MD  Subjective:   HPI: Patient is a 57 y.o. female here for left knee pain.  Patient has known severe arthritis of her left knee. She's seen orthopedics previously for this, has had steroid injection and most recently viscosupplementation 3 months ago with mild relief. She has tried ibuprofen and tylenol which help. Pain goes to lateral left knee and posteriorly. Pain currently at a 2/10 but has gone up to 10/10 and been sharp, worse with ambulation. No skin changes, numbness. No new injuries.  Past Medical History:  Diagnosis Date  . Anemia   . Arthritis    "knees" (08/26/2016)  . Estrogen receptor negative status (ER-)   . Family history of breast cancer 07/20/2016  . Family history of pancreatic cancer 07/20/2016  . GERD (gastroesophageal reflux disease)   . Heart murmur   . Hypertension   . Malignant neoplasm of upper-outer quadrant of right female breast ([place])   . Obesity   . OSA on CPAP   . Personal history of radiation therapy     Current Outpatient Medications on File Prior to Visit  Medication Sig Dispense Refill  . amLODipine (NORVASC) 5 MG tablet Take 5 mg by mouth daily.  7  . anastrozole (ARIMIDEX) 1 MG tablet Take 1 tablet (1 mg total) by mouth daily. 90 tablet 4  . doxazosin (CARDURA) 2 MG tablet Take 2 mg by mouth daily.  11  . hydrocortisone 2.5 % cream Apply 1 application topically daily as needed (skin redness).    Marland Kitchen ketoconazole (NIZORAL) 2 % cream Apply 1 application topically daily as needed for irritation.    Marland Kitchen losartan-hydrochlorothiazide (HYZAAR) 100-25 MG tablet Take 1 tablet by mouth daily.     No current facility-administered medications on file prior to visit.     Past Surgical History:  Procedure Laterality Date  . ABDOMINAL HYSTERECTOMY    . BREAST BIOPSY Right 07/06/2016; 08/18/2016  . BREAST LUMPECTOMY Right 2018  . BREAST LUMPECTOMY WITH RADIOACTIVE SEED AND SENTINEL LYMPH NODE BIOPSY Right 08/18/2016   Procedure: RADIOACTIVE SEED GUIDED RIGHT BREAST LUMPECTOMY, RIGHT AXILLARY SENTINEL LYMPH NODE BIOPSY.;  Surgeon: Excell Seltzer, MD;  Location: [place];  Service: General;  Laterality: Right;  . BREAST REDUCTION SURGERY Bilateral 08/26/2016   Procedure: right oncoplastic MAMMARY REDUCTION  (BREAST) and left breast reduction for symmetry with bilateral resection of nipple aereola;  Surgeon: [name], MD;  Location: [place];  Service: Plastics;  Laterality: Bilateral;  . BUNIONECTOMY Left   . DILATION AND CURETTAGE OF UTERUS  1983  . LAPAROSCOPIC CHOLECYSTECTOMY    . REDUCTION MAMMAPLASTY Bilateral 08/26/2016   oncoplastic MAMMARY REDUCTION  (BREAST) and left breast reduction for symmetry with bilateral resection of nipple aereola/notes 08/26/2016  . TUBAL LIGATION  ~2000    No Known Allergies  Social History   Socioeconomic History  . Marital status: Married    Spouse name: Not on file  . Number of children: 2  . Years of education: Not on file  . Highest education level: Not on file  Occupational History  . Not on file  Social Needs  . Financial resource strain: Not on file  . Food insecurity:    Worry: Not on file    Inability: Not on file  . Transportation needs:    Medical: Not on file    Non-medical: Not on file  Tobacco Use  . Smoking status: Never Smoker  . Smokeless tobacco: Never Used  Substance and Sexual Activity  .  Alcohol use: No  . Drug use: No  . Sexual activity: Yes    Birth control/protection: Surgical  Lifestyle  . Physical activity:    Days per week: Not on file    Minutes per session: Not on file  . Stress: Not on file  Relationships  . Social connections:    Talks on phone: Not on file    Gets together: Not on file    Attends religious service: Not on file    Active member of club or organization: Not on file    Attends meetings of clubs or organizations: Not on file    Relationship status: Not on file  . Intimate partner violence:     Fear of current or ex partner: Not on file    Emotionally abused: Not on file    Physically abused: Not on file    Forced sexual activity: Not on file  Other Topics Concern  . Not on file  Social History Narrative  . Not on file    Family History  Problem Relation Age of Onset  . Hypertension Mother   . Leukemia Mother 79  . Hypertension Father   . Diabetes Father   . Pancreatic cancer Father 7       died 8  . Breast cancer Paternal Aunt 24       died in 66's  . Kidney disease Maternal Grandmother   . Breast cancer Paternal Aunt 34       died in 39's  . Breast cancer Paternal Aunt 73       died in 48's  . Breast cancer Paternal Aunt 54    BP (!) 183/86   Pulse 64   Ht 5\' 6"  (1.676 m)   Wt (!) 350 lb (158.8 kg)   BMI 56.49 kg/m   Review of Systems: See HPI above.     Objective:  Physical Exam:  Gen: NAD, comfortable in exam room  Left knee: No gross deformity, ecchymoses.  Mild effusion. TTP medial joint line.  No other tenderness. ROM 0 - 120 degrees with 5/5 strength flexion and extension. Negative ant/post drawers. Negative valgus/varus testing. Negative lachmans. Negative mcmurrays, apleys, patellar apprehension. NV intact distally.  Right knee: No deformity. FROM with 5/5 strength. No tenderness to palpation. NVI distally.   Assessment & Plan:  1. Left knee pain - independently reviewed radiographs and no acute findings but severe medial arthritis.  Viscosupplementation 3 months ago.  Last steroid injection did not help much but was done medially without ultrasound guidance.  Discussed tylenol, topical medications, ibuprofen, supplements that may help.  Injection given with ultrasound guidance today.  Shown home exercises.  Heat or ice.  F/u in 1 month.  After informed written consent timeout was performed, patient was lying supine on exam table. Left knee was prepped with alcohol swab and utilizing superolateral approach with ultrasound guidance,  patient's left knee was injected intraarticularly with 3:1 bupivicaine: depomedrol. Patient tolerated the procedure well without immediate complications.

## 2017-12-29 ENCOUNTER — Other Ambulatory Visit: Payer: Self-pay | Admitting: Oncology

## 2018-01-04 ENCOUNTER — Encounter: Payer: Self-pay | Admitting: Family Medicine

## 2018-01-04 ENCOUNTER — Ambulatory Visit: Payer: BLUE CROSS/BLUE SHIELD | Admitting: Family Medicine

## 2018-01-04 VITALS — BP 176/86 | HR 84 | Ht 67.0 in | Wt 350.0 lb

## 2018-01-04 DIAGNOSIS — M25562 Pain in left knee: Secondary | ICD-10-CM

## 2018-01-04 NOTE — Patient Instructions (Signed)
Your pain is due to arthritis. These are the different medications you can take for this: Tylenol 500mg  1-2 tabs three times a day for pain. Capsaicin, aspercreme, or biofreeze topically up to four times a day may also help with pain. Some supplements that may help for arthritis: Boswellia extract, curcumin, pycnogenol Ibuprofen 600mg  three times a day with food for pain and inflammation as needed. It's important that you continue to stay active. Straight leg raises, knee extensions 3 sets of 10 once a day (add ankle weight if these become too easy). Consider physical therapy to strengthen muscles around the joint that hurts to take pressure off of the joint itself. Shoe inserts with good arch support may be helpful. Consider medial unloader knee brace. Heat or ice 15 minutes at a time 3-4 times a day as needed to help with pain. Water aerobics and recumbent bike with low resistance are the best two types of exercise for arthritis though any exercise is ok as long as it doesn't worsen the pain. We will refer you to a dietitian to optimize your diet. Follow up with me as needed - it would be another 2 months before you could repeat the cortisone and/or gel shots.

## 2018-01-04 NOTE — Progress Notes (Signed)
PCP: Loraine Leriche., MD  Subjective:   HPI: Patient is a 57 y.o. female here for left knee pain.  11/5: Patient has known severe arthritis of her left knee. She's seen orthopedics previously for this, has had steroid injection and most recently viscosupplementation 3 months ago with mild relief. She has tried ibuprofen and tylenol which help. Pain goes to lateral left knee and posteriorly. Pain currently at a 2/10 but has gone up to 10/10 and been sharp, worse with ambulation. No skin changes, numbness. No new injuries.  12/5: Patient returns with mild improvement compared to last visit.   Pain is 0/10 at rest but up to 8/10 with movement. Taking ibuprofen twice a day. No skin changes.  Past Medical History:  Diagnosis Date  . Anemia   . Arthritis    "knees" (08/26/2016)  . Estrogen receptor negative status (ER-)   . Family history of breast cancer 07/20/2016  . Family history of pancreatic cancer 07/20/2016  . GERD (gastroesophageal reflux disease)   . Heart murmur   . Hypertension   . Malignant neoplasm of upper-outer quadrant of right female breast ([place])   . Obesity   . OSA on CPAP   . Personal history of radiation therapy     Current Outpatient Medications on File Prior to Visit  Medication Sig Dispense Refill  . amLODipine (NORVASC) 5 MG tablet Take 5 mg by mouth daily.  7  . anastrozole (ARIMIDEX) 1 MG tablet TAKE 1 TABLET BY MOUTH EVERY DAY 90 tablet 4  . doxazosin (CARDURA) 2 MG tablet Take 2 mg by mouth daily.  11  . hydrocortisone 2.5 % cream Apply 1 application topically daily as needed (skin redness).    Marland Kitchen ketoconazole (NIZORAL) 2 % cream Apply 1 application topically daily as needed for irritation.    Marland Kitchen losartan-hydrochlorothiazide (HYZAAR) 100-25 MG tablet Take 1 tablet by mouth daily.     No current facility-administered medications on file prior to visit.     Past Surgical History:  Procedure Laterality Date  . ABDOMINAL HYSTERECTOMY    .  BREAST BIOPSY Right 07/06/2016; 08/18/2016  . BREAST LUMPECTOMY Right 2018  . BREAST LUMPECTOMY WITH RADIOACTIVE SEED AND SENTINEL LYMPH NODE BIOPSY Right 08/18/2016   Procedure: RADIOACTIVE SEED GUIDED RIGHT BREAST LUMPECTOMY, RIGHT AXILLARY SENTINEL LYMPH NODE BIOPSY.;  Surgeon: Excell Seltzer, MD;  Location: [facility];  Service: General;  Laterality: Right;  . BREAST REDUCTION SURGERY Bilateral 08/26/2016   Procedure: right oncoplastic MAMMARY REDUCTION  (BREAST) and left breast reduction for symmetry with bilateral resection of nipple aereola;  Surgeon: [name], MD;  Location: [facility];  Service: Plastics;  Laterality: Bilateral;  . BUNIONECTOMY Left   . DILATION AND CURETTAGE OF UTERUS  1983  . LAPAROSCOPIC CHOLECYSTECTOMY    . REDUCTION MAMMAPLASTY Bilateral 08/26/2016   oncoplastic MAMMARY REDUCTION  (BREAST) and left breast reduction for symmetry with bilateral resection of nipple aereola/notes 08/26/2016  . TUBAL LIGATION  ~2000    No Known Allergies  Social History   Socioeconomic History  . Marital status: Married    Spouse name: Not on file  . Number of children: 2  . Years of education: Not on file  . Highest education level: Not on file  Occupational History  . Not on file  Social Needs  . Financial resource strain: Not on file  . Food insecurity:    Worry: Not on file    Inability: Not on file  . Transportation needs:  Medical: Not on file    Non-medical: Not on file  Tobacco Use  . Smoking status: Never Smoker  . Smokeless tobacco: Never Used  Substance and Sexual Activity  . Alcohol use: No  . Drug use: No  . Sexual activity: Yes    Birth control/protection: Surgical  Lifestyle  . Physical activity:    Days per week: Not on file    Minutes per session: Not on file  . Stress: Not on file  Relationships  . Social connections:    Talks on phone: Not on file    Gets together: Not on file    Attends religious service: Not on file    Active member of  club or organization: Not on file    Attends meetings of clubs or organizations: Not on file    Relationship status: Not on file  . Intimate partner violence:    Fear of current or ex partner: Not on file    Emotionally abused: Not on file    Physically abused: Not on file    Forced sexual activity: Not on file  Other Topics Concern  . Not on file  Social History Narrative  . Not on file    Family History  Problem Relation Age of Onset  . Hypertension Mother   . Leukemia Mother 44  . Hypertension Father   . Diabetes Father   . Pancreatic cancer Father 68       died 31  . Breast cancer Paternal Aunt 31       died in 53's  . Kidney disease Maternal Grandmother   . Breast cancer Paternal Aunt 70       died in 76's  . Breast cancer Paternal Aunt 51       died in 32's  . Breast cancer Paternal Aunt 89    BP (!) 176/86   Pulse 84   Ht 5\' 7"  (1.702 m)   Wt (!) 350 lb (158.8 kg)   BMI 54.82 kg/m   Review of Systems: See HPI above.     Objective:  Physical Exam:  Gen: NAD, comfortable in exam room  Left knee: No gross deformity, ecchymoses, swelling. Mild TTP medial > lateral joint lines. FROM with 5/5 strength. Negative ant/post drawers. Negative valgus/varus testing. Negative lachmans. Negative mcmurrays, apleys, patellar apprehension. NV intact distally.   Assessment & Plan:  1. Left knee pain - Severe medial arthritis.  S/p intraarticular injection without mild benefit.  Tylenol, topical medications, supplements reviewed, ibuprofen as needed.  Heat or ice.  F/u prn.  Referring to dietitian as will need weight loss prior to replacement surgery.

## 2018-03-28 ENCOUNTER — Ambulatory Visit: Payer: BLUE CROSS/BLUE SHIELD | Admitting: Family Medicine

## 2018-03-28 ENCOUNTER — Encounter: Payer: Self-pay | Admitting: Family Medicine

## 2018-03-28 VITALS — Ht 65.0 in | Wt 350.0 lb

## 2018-03-28 DIAGNOSIS — M25562 Pain in left knee: Secondary | ICD-10-CM

## 2018-03-28 MED ORDER — METHYLPREDNISOLONE ACETATE 40 MG/ML IJ SUSP
40.0000 mg | Freq: Once | INTRAMUSCULAR | Status: AC
  Administered 2018-03-28: 40 mg via INTRA_ARTICULAR

## 2018-03-28 NOTE — Patient Instructions (Signed)
Your pain is due to arthritis. These are the different medications you can take for this: Tylenol 500mg  1-2 tabs three times a day for pain. Capsaicin, aspercreme, or biofreeze topically up to four times a day may also help with pain. Some supplements that may help for arthritis: Boswellia extract, curcumin, pycnogenol Ibuprofen 600mg  three times a day with food for pain and inflammation as needed. It's important that you continue to stay active. Straight leg raises, knee extensions 3 sets of 10 once a day (add ankle weight if these become too easy). We repeated the steroid injection today. We can do the gel shot series if this doesn't work well enough - just let us know. Consider physical therapy to strengthen muscles around the joint that hurts to take pressure off of the joint itself. Shoe inserts with good arch support may be helpful. Heat or ice 15 minutes at a time 3-4 times a day as needed to help with pain. Water aerobics and recumbent bike with low resistance are the best two types of exercise for arthritis though any exercise is ok as long as it doesn't worsen the pain. We will refer you to a dietitian to optimize your diet.

## 2018-03-29 ENCOUNTER — Encounter: Payer: Self-pay | Admitting: Family Medicine

## 2018-03-29 NOTE — Progress Notes (Signed)
PCP: Loraine Leriche., MD  Subjective:   HPI: Patient is a 58 y.o. female here for left knee pain.  11/5: Patient has known severe arthritis of her left knee. She's seen orthopedics previously for this, has had steroid injection and most recently viscosupplementation 3 months ago with mild relief. She has tried ibuprofen and tylenol which help. Pain goes to lateral left knee and posteriorly. Pain currently at a 2/10 but has gone up to 10/10 and been sharp, worse with ambulation. No skin changes, numbness. No new injuries.  01/04/18: Patient returns with mild improvement compared to last visit.   Pain is 0/10 at rest but up to 8/10 with movement. Taking ibuprofen twice a day. No skin changes.  03/28/18: Patient reports her left knee pain anteriorly is a 7 out of 10 and sharp. Has a stiffness and difficulty bending or extending this knee. Worse with prolonged sitting then going to get up. She is doing water aerobics 3 times a week. She would like to repeat cortisone injection today. She has not yet heard from dietitian. No skin changes or numbness.  Past Medical History:  Diagnosis Date  . Anemia   . Arthritis    "knees" (08/26/2016)  . Estrogen receptor negative status (ER-)   . Family history of breast cancer 07/20/2016  . Family history of pancreatic cancer 07/20/2016  . GERD (gastroesophageal reflux disease)   . Heart murmur   . Hypertension   . Malignant neoplasm of upper-outer quadrant of right female breast ([place])   . Obesity   . OSA on CPAP   . Personal history of radiation therapy     Current Outpatient Medications on File Prior to Visit  Medication Sig Dispense Refill  . amLODipine (NORVASC) 5 MG tablet Take 5 mg by mouth daily.  7  . anastrozole (ARIMIDEX) 1 MG tablet TAKE 1 TABLET BY MOUTH EVERY DAY 90 tablet 4  . doxazosin (CARDURA) 2 MG tablet Take 2 mg by mouth daily.  11  . hydrocortisone 2.5 % cream Apply 1 application topically daily as needed  (skin redness).    Marland Kitchen ketoconazole (NIZORAL) 2 % cream Apply 1 application topically daily as needed for irritation.    Marland Kitchen losartan-hydrochlorothiazide (HYZAAR) 100-25 MG tablet Take 1 tablet by mouth daily.     No current facility-administered medications on file prior to visit.     Past Surgical History:  Procedure Laterality Date  . ABDOMINAL HYSTERECTOMY    . BREAST BIOPSY Right 07/06/2016; 08/18/2016  . BREAST LUMPECTOMY Right 2018  . BREAST LUMPECTOMY WITH RADIOACTIVE SEED AND SENTINEL LYMPH NODE BIOPSY Right 08/18/2016   Procedure: RADIOACTIVE SEED GUIDED RIGHT BREAST LUMPECTOMY, RIGHT AXILLARY SENTINEL LYMPH NODE BIOPSY.;  Surgeon: Excell Seltzer, MD;  Location: [facility];  Service: General;  Laterality: Right;  . BREAST REDUCTION SURGERY Bilateral 08/26/2016   Procedure: right oncoplastic MAMMARY REDUCTION  (BREAST) and left breast reduction for symmetry with bilateral resection of nipple aereola;  Surgeon: [name], MD;  Location: [place] [facility] [place];  Service: Plastics;  Laterality: Bilateral;  . BUNIONECTOMY Left   . DILATION AND CURETTAGE OF UTERUS  1983  . LAPAROSCOPIC CHOLECYSTECTOMY    . REDUCTION MAMMAPLASTY Bilateral 08/26/2016   oncoplastic MAMMARY REDUCTION  (BREAST) and left breast reduction for symmetry with bilateral resection of nipple aereola/notes 08/26/2016  . TUBAL LIGATION  ~2000    No Known Allergies  Social History   Socioeconomic History  . Marital status: Married    Spouse name: Not on file  .  Number of children: 2  . Years of education: Not on file  . Highest education level: Not on file  Occupational History  . Not on file  Social Needs  . Financial resource strain: Not on file  . Food insecurity:    Worry: Not on file    Inability: Not on file  . Transportation needs:    Medical: Not on file    Non-medical: Not on file  Tobacco Use  . Smoking status: Never Smoker  . Smokeless tobacco: Never Used  Substance and Sexual Activity  . Alcohol use: No   . Drug use: No  . Sexual activity: Yes    Birth control/protection: Surgical  Lifestyle  . Physical activity:    Days per week: Not on file    Minutes per session: Not on file  . Stress: Not on file  Relationships  . Social connections:    Talks on phone: Not on file    Gets together: Not on file    Attends religious service: Not on file    Active member of club or organization: Not on file    Attends meetings of clubs or organizations: Not on file    Relationship status: Not on file  . Intimate partner violence:    Fear of current or ex partner: Not on file    Emotionally abused: Not on file    Physically abused: Not on file    Forced sexual activity: Not on file  Other Topics Concern  . Not on file  Social History Narrative  . Not on file    Family History  Problem Relation Age of Onset  . Hypertension Mother   . Leukemia Mother 101  . Hypertension Father   . Diabetes Father   . Pancreatic cancer Father 33       died 19  . Breast cancer Paternal Aunt 69       died in 27's  . Kidney disease Maternal Grandmother   . Breast cancer Paternal Aunt 73       died in 11's  . Breast cancer Paternal Aunt 50       died in 52's  . Breast cancer Paternal Aunt 57    Ht 5\' 5"  (1.651 m)   Wt (!) 350 lb (158.8 kg)   BMI 58.24 kg/m   Review of Systems: See HPI above.     Objective:  Physical Exam:  Gen: NAD, comfortable in exam room  Left knee: Deformity, ecchymoses, swelling. Mild tenderness to palpation medial greater than lateral joint lines. Full range of motion with 5 out of 5 strength. Negative anterior posterior drawers, negative valgus and varus testing. Negative McMurray's, Apley's, patellar apprehension. Neurovascularly intact distally.  Assessment & Plan:  1. Left knee pain -secondary to severe medial arthritis.  We repeated intra-articular injection today with ultrasound guidance.  We discussed Tylenol, topical medications, supplements, ibuprofen as  needed.  We will consider repeating her Visco supplementation.  Ultimately she will need knee replacement but will need to lose weight prior to this.  Will again check on the referral to dietitian.  After informed written consent timeout was performed, patient was lying supine on exam table. Left knee was prepped with alcohol swab and utilizing superolateral approach with ultrasound guidance, patient's left knee was injected intraarticularly with 3:1 bupivicaine: depomedrol. Patient tolerated the procedure well without immediate complications.

## 2018-03-29 NOTE — Addendum Note (Signed)
Addended by: Sherrie George F on: 03/29/2018 02:43 PM   Modules accepted: Orders

## 2018-04-30 ENCOUNTER — Other Ambulatory Visit: Payer: Self-pay

## 2018-04-30 ENCOUNTER — Encounter: Payer: Self-pay | Admitting: Family Medicine

## 2018-04-30 ENCOUNTER — Ambulatory Visit (INDEPENDENT_AMBULATORY_CARE_PROVIDER_SITE_OTHER): Payer: BLUE CROSS/BLUE SHIELD | Admitting: Family Medicine

## 2018-04-30 VITALS — BP 143/82 | HR 83 | Temp 98.4°F | Ht 65.0 in | Wt 350.0 lb

## 2018-04-30 DIAGNOSIS — M1712 Unilateral primary osteoarthritis, left knee: Secondary | ICD-10-CM | POA: Diagnosis not present

## 2018-04-30 DIAGNOSIS — M25562 Pain in left knee: Secondary | ICD-10-CM

## 2018-04-30 NOTE — Progress Notes (Signed)
  Jodi Mccormick - 58 y.o. female MRN [id number]  Date of birth: 23-Sep-1960    SUBJECTIVE:      Chief Complaint: Left knee pain  HPI:  58 yr old female here today for injection #1 of 3 of Gelsyn.   ROS:     See HPI. All other reviewed systems negative.  PERTINENT  PMH / PSH FH / / SH:  Past Medical, Surgical, Social, and Family History Reviewed & Updated in the EMR.    OBJECTIVE: There were no vitals taken for this visit.  Physical Exam:  Vital signs are reviewed.  Procedure performed: knee intraarticular corticosteroid injection; [place] assisted Consent obtained and verified. Time-out conducted. Noted no overlying erythema, induration, or other signs of local infection. The LEFT medial joint space was palpated and marked. The overlying skin was prepped in a sterile fashion. Topical analgesic spray: Ethyl chloride. Joint: LEFT knee Needle: 22GA, 1.5" Completed without difficulty. Meds: bupivicaine 3cc, Gelsyn #1of3   ASSESSMENT & PLAN:  1. Left knee pain 2/2 arthritis. Gelsyn injection performed as described above. Will return in 1 week for injection 2 of 3.

## 2018-05-01 ENCOUNTER — Encounter: Payer: Self-pay | Admitting: Family Medicine

## 2018-05-07 ENCOUNTER — Encounter: Payer: Self-pay | Admitting: Family Medicine

## 2018-05-07 ENCOUNTER — Ambulatory Visit (INDEPENDENT_AMBULATORY_CARE_PROVIDER_SITE_OTHER): Payer: BLUE CROSS/BLUE SHIELD | Admitting: Family Medicine

## 2018-05-07 ENCOUNTER — Other Ambulatory Visit: Payer: Self-pay

## 2018-05-07 DIAGNOSIS — M1712 Unilateral primary osteoarthritis, left knee: Secondary | ICD-10-CM

## 2018-05-07 NOTE — Progress Notes (Signed)
  Jodi Mccormick - 58 y.o. female MRN [id number]  Date of birth: 10/24/1960    SUBJECTIVE:      Chief Complaint: Left knee pain  HPI:   3/30: 58 yr old female here today for injection #1 of 3 of Gelsyn.  4/6: Patient presents today for injection #2 of 3 of Gelsyn.   ROS:     See HPI. All other reviewed systems negative.  PERTINENT  PMH / PSH FH / / SH:  Past Medical, Surgical, Social, and Family History Reviewed & Updated in the EMR.    OBJECTIVE: BP (!) 157/86   Pulse 75   Temp 98.6 F (37 C) (Oral)   Ht 5\' 5"  (1.651 m)   Wt (!) 347 lb (157.4 kg)   BMI 57.74 kg/m   Physical Exam:  Vital signs are reviewed.    Procedure performed: knee intra-articular injection; ultrasound guided Consent obtained and verified. Time-out conducted. Noted no overlying erythema, induration, or other signs of local infection. The  left medial joint space was identified under ultrasound and marked. The overlying skin was prepped in a sterile fashion. Topical analgesic spray: Ethyl chloride. Joint: Left knee Needle: 22GA, 1.5" Completed without difficulty. Meds: Bupivacaine 4 cc, Gelsyn #2 of 3    ASSESSMENT & PLAN:  1.  Left knee pain secondary to arthritis. Gelsyn injection performed as described above.  Will return in 1 week for injection #3 of 3.

## 2018-05-08 ENCOUNTER — Encounter: Payer: Self-pay | Admitting: Family Medicine

## 2018-05-14 ENCOUNTER — Ambulatory Visit: Payer: BLUE CROSS/BLUE SHIELD | Admitting: Family Medicine

## 2018-05-16 ENCOUNTER — Ambulatory Visit: Payer: BLUE CROSS/BLUE SHIELD | Admitting: Family Medicine

## 2018-05-16 ENCOUNTER — Encounter: Payer: Self-pay | Admitting: Family Medicine

## 2018-05-16 ENCOUNTER — Other Ambulatory Visit: Payer: Self-pay

## 2018-05-16 DIAGNOSIS — M25562 Pain in left knee: Secondary | ICD-10-CM

## 2018-05-16 DIAGNOSIS — G8929 Other chronic pain: Secondary | ICD-10-CM | POA: Diagnosis not present

## 2018-05-16 DIAGNOSIS — M1712 Unilateral primary osteoarthritis, left knee: Secondary | ICD-10-CM

## 2018-05-16 NOTE — Assessment & Plan Note (Signed)
INJECTION: Patient was given informed consent, signed copy in the chart. Appropriate time out was taken. Area prepped and draped in usual sterile fashion. 1 cc of methylprednisolone 40 mg/ml plus  3 cc of 0.5% sensoricaine without epinephrine was injected into the L knee using a(n) ultrasound guided anteromedial approach. The patient tolerated the procedure well. There were no complications. Post procedure instructions were given.  Reviewed x-rays independently of L knee from Nov 2019 demonstrating severe medial OA of the L knee. This was gelsyn #3. Recommend quadriceps and hip abductor exercises for strengthening. Activity as tolerated. Nsaids as needed. Follow up in 4 weeks if still symptomatic.

## 2018-05-16 NOTE — Progress Notes (Signed)
  Jodi Mccormick - 58 y.o. female MRN [id number]  Date of birth: 19-Sep-1960   Chief complaint: Left knee pain  SUBJECTIVE:    History of present illness: Here today for 3rd and final Gelsyn-3 injection. Reports mild relief from knee pain after injection two. Denies complications thus far. Has been doing leg flexion/extension ROM exercises at home. No new injuries or trauma.   Review of systems:  Per HPI; in addition no fever, no rash, no additional weakness, no additional numbness, no additional paresthesias, and no additional fall/injury   Interval past medical history, surgical history, family history, and social history obtained and are unchanged. She is a nonsmoker.  Medications reviewed and unchanged. Allergies: no known drug allergies  OBJECTIVE:  Physical exam: Vital signs are reviewed. BP 160/88, T98.3, HR 65, H65", W350lbs Gen.: Alert, oriented, appears stated age, in no apparent distress Integumentary: No rashes, erythema, or ecchymosis Neurologic: nonfocal Gait: antalgic favoring L side Musculoskeletal: No abnormalities of L knee. TTP of medial joint line. Full ROM. +PF crepitus with ROM. Strength 5/5 in flexion and extension. Stable to ligamentous testing. N/v intact.  ASSESSMENT & PLAN: Primary osteoarthritis of left knee INJECTION: Patient was given informed consent, signed copy in the chart. Appropriate time out was taken. Area prepped and draped in usual sterile fashion. 1 cc of methylprednisolone 40 mg/ml plus  3 cc of 0.5% sensoricaine without epinephrine was injected into the L knee using a(n) ultrasound guided anteromedial approach. The patient tolerated the procedure well. There were no complications. Post procedure instructions were given.  Reviewed x-rays independently of L knee from Nov 2019 demonstrating severe medial OA of the L knee. This was gelsyn #3. Recommend quadriceps and hip abductor exercises for strengthening. Activity as tolerated. Nsaids as  needed. Follow up in 4 weeks if still symptomatic.   Clydene Laming, DO Sports Medicine Fellow [facility]

## 2018-05-18 ENCOUNTER — Encounter: Payer: Self-pay | Admitting: Family Medicine

## 2018-06-12 ENCOUNTER — Telehealth: Payer: Self-pay | Admitting: Family Medicine

## 2018-06-12 NOTE — Telephone Encounter (Signed)
Spoke with patient. I have sent her instructions on how to sign up for MyChart and she will be sending a picture of her knee

## 2018-06-12 NOTE — Telephone Encounter (Signed)
Patient calling with concerns. Last night she noticed two small marks that look like bruises on her left knee and when she woke up this morning there was a third mark. Patient states it is not sore to the touch.   She is asking if this is a result of the three gel injections she recently had and if it is normal to have these appear

## 2018-06-12 NOTE — Telephone Encounter (Signed)
Gel injections wouldn't lead to skin discoloration.  They also wouldn't cause something like this 4 weeks out from them (bruising can do it if it's within a couple weeks but it would be sore).  If she could take a picture and submit it via mychart that would be helpful without having to come in though it doesn't look like she's signed up for mychart.  I'd be happy to take a look if she would like.

## 2018-06-13 NOTE — Telephone Encounter (Signed)
See patient email for details and response.

## 2018-07-17 ENCOUNTER — Other Ambulatory Visit: Payer: Self-pay | Admitting: Oncology

## 2018-07-17 DIAGNOSIS — Z9889 Other specified postprocedural states: Secondary | ICD-10-CM

## 2018-07-20 ENCOUNTER — Telehealth: Payer: Self-pay | Admitting: Oncology

## 2018-07-20 NOTE — Telephone Encounter (Signed)
Returned call re rescheduling 6/22 f/u to after 7/1. Left message re 8/3 f/u. Schedule mailed.

## 2018-07-23 ENCOUNTER — Ambulatory Visit: Payer: BLUE CROSS/BLUE SHIELD | Admitting: Oncology

## 2018-08-28 ENCOUNTER — Other Ambulatory Visit: Payer: Self-pay

## 2018-08-28 ENCOUNTER — Ambulatory Visit
Admission: RE | Admit: 2018-08-28 | Discharge: 2018-08-28 | Disposition: A | Discharge status: Home / Self Care | Payer: BC Managed Care – PPO | Source: Ambulatory Visit | Attending: Oncology | Admitting: Oncology

## 2018-08-28 DIAGNOSIS — Z9889 Other specified postprocedural states: Secondary | ICD-10-CM

## 2018-09-03 ENCOUNTER — Other Ambulatory Visit: Payer: Self-pay

## 2018-09-03 ENCOUNTER — Inpatient Hospital Stay: Payer: BC Managed Care – PPO | Attending: Oncology | Admitting: Oncology

## 2018-09-03 VITALS — BP 154/67 | HR 56 | Temp 98.5°F | Resp 20 | Ht 65.0 in | Wt 367.0 lb

## 2018-09-03 DIAGNOSIS — R232 Flushing: Secondary | ICD-10-CM | POA: Insufficient documentation

## 2018-09-03 DIAGNOSIS — R011 Cardiac murmur, unspecified: Secondary | ICD-10-CM | POA: Diagnosis not present

## 2018-09-03 DIAGNOSIS — G4730 Sleep apnea, unspecified: Secondary | ICD-10-CM | POA: Diagnosis not present

## 2018-09-03 DIAGNOSIS — M129 Arthropathy, unspecified: Secondary | ICD-10-CM | POA: Diagnosis not present

## 2018-09-03 DIAGNOSIS — Z923 Personal history of irradiation: Secondary | ICD-10-CM | POA: Insufficient documentation

## 2018-09-03 DIAGNOSIS — Z171 Estrogen receptor negative status [ER-]: Secondary | ICD-10-CM | POA: Diagnosis not present

## 2018-09-03 DIAGNOSIS — Z79899 Other long term (current) drug therapy: Secondary | ICD-10-CM | POA: Diagnosis not present

## 2018-09-03 DIAGNOSIS — E669 Obesity, unspecified: Secondary | ICD-10-CM | POA: Diagnosis not present

## 2018-09-03 DIAGNOSIS — Z90710 Acquired absence of both cervix and uterus: Secondary | ICD-10-CM | POA: Diagnosis not present

## 2018-09-03 DIAGNOSIS — Z803 Family history of malignant neoplasm of breast: Secondary | ICD-10-CM | POA: Insufficient documentation

## 2018-09-03 DIAGNOSIS — Z170 Estrogen receptor positive status [ER+]: Secondary | ICD-10-CM | POA: Diagnosis not present

## 2018-09-03 DIAGNOSIS — Z90722 Acquired absence of ovaries, bilateral: Secondary | ICD-10-CM | POA: Insufficient documentation

## 2018-09-03 DIAGNOSIS — Z79811 Long term (current) use of aromatase inhibitors: Secondary | ICD-10-CM | POA: Diagnosis not present

## 2018-09-03 DIAGNOSIS — K219 Gastro-esophageal reflux disease without esophagitis: Secondary | ICD-10-CM | POA: Diagnosis not present

## 2018-09-03 DIAGNOSIS — C50411 Malignant neoplasm of upper-outer quadrant of right female breast: Secondary | ICD-10-CM | POA: Diagnosis present

## 2018-09-03 DIAGNOSIS — Z800 Family history of malignant neoplasm of digestive organs: Secondary | ICD-10-CM | POA: Insufficient documentation

## 2018-09-03 NOTE — Progress Notes (Signed)
[facility]  [phone] [phone]) [phone]     ID: Jodi Mccormick DOB: 03-09-1960  MR#: [id number]  ZHG#:[id number]  Patient Care Team: Loraine Leriche., MD as PCP - General (Internal Medicine) Excell Seltzer, MD as Consulting Physician (General Surgery) Magrinat, Virgie Dad, MD as Consulting Physician (Oncology) Gery Pray, MD as Consulting Physician (Radiation Oncology) OTHER MD:  CHIEF COMPLAINT: Invasive ductal carcinoma of the right breast  CURRENT TREATMENT:  anastrozole    BREAST CANCER HISTORY: From the original intake note:  The patient had screening mammography in [facility] suggesting a change in her right breast. She was referred to [facility] where 07/06/2016 she underwent stereotactic biopsy of the upper-outer quadrant of the right breast, which showed ([id number]) an invasive ductal carcinoma, grade 2 or 3, in the setting of ductal carcinoma in situ, grade 3. There was not enough invasive tumor to do a prognostic panel but the noninvasive breast cancer was estrogen and progesterone receptor negative.  Her subsequent history is as detailed below   INTERVAL HISTORY: Jodi Mccormick returns today for follow-up and treatment of her estrogen receptor negative breast cancer.  She continues on anastrozole. She has hot flashes; she states that she has fairly restless sleep as a result.  There is no bone density screening on file.  However patient is morbidly obese.  Since her last visit here, she underwent a digital diagnostic bilateral mammogram with tomography on 08/28/2018 showing: Breast Density Category A. There is no mammographic evidence for malignancy.     REVIEW OF SYSTEMS: Jodi Mccormick has been taking appropriate precautions against the spread of the pandemic. She has started back at work on site in 08/2018. The patient denies unusual headaches, visual changes, nausea, vomiting, or dizziness. There has been no unusual cough,  phlegm production, or pleurisy. This been no change in bowel or bladder habits. The patient denies unexplained fatigue or unexplained weight loss, bleeding, rash, or fever. A detailed review of systems was otherwise noncontributory.     PAST MEDICAL HISTORY: Past Medical History:  Diagnosis Date  . Anemia   . Arthritis    "knees" (08/26/2016)  . Estrogen receptor negative status (ER-)   . Family history of breast cancer 07/20/2016  . Family history of pancreatic cancer 07/20/2016  . GERD (gastroesophageal reflux disease)   . Heart murmur   . Hypertension   . Malignant neoplasm of upper-outer quadrant of right female breast ([place])   . Obesity   . OSA on CPAP   . Personal history of radiation therapy     PAST SURGICAL HISTORY: Past Surgical History:  Procedure Laterality Date  . ABDOMINAL HYSTERECTOMY    . BREAST BIOPSY Right 07/06/2016; 08/18/2016  . BREAST LUMPECTOMY Right 2018  . BREAST LUMPECTOMY WITH RADIOACTIVE SEED AND SENTINEL LYMPH NODE BIOPSY Right 08/18/2016   Procedure: RADIOACTIVE SEED GUIDED RIGHT BREAST LUMPECTOMY, RIGHT AXILLARY SENTINEL LYMPH NODE BIOPSY.;  Surgeon: Excell Seltzer, MD;  Location: [facility];  Service: General;  Laterality: Right;  . BREAST REDUCTION SURGERY Bilateral 08/26/2016   Procedure: right oncoplastic MAMMARY REDUCTION  (BREAST) and left breast reduction for symmetry with bilateral resection of nipple aereola;  Surgeon: [name], MD;  Location: [facility];  Service: Plastics;  Laterality: Bilateral;  . BUNIONECTOMY Left   . DILATION AND CURETTAGE OF UTERUS  1983  . LAPAROSCOPIC CHOLECYSTECTOMY    . REDUCTION MAMMAPLASTY Bilateral 08/26/2016   oncoplastic MAMMARY REDUCTION  (BREAST) and left breast reduction for symmetry with bilateral resection of nipple  aereola/notes 08/26/2016  . TUBAL LIGATION  ~2000    FAMILY HISTORY Family History  Problem Relation Age of Onset  . Hypertension Mother   . Leukemia Mother 48  . Hypertension Father   .  Diabetes Father   . Pancreatic cancer Father 73       died 10  . Breast cancer Paternal Aunt 17       died in 42's  . Kidney disease Maternal Grandmother   . Breast cancer Paternal Aunt 30       died in 28's  . Breast cancer Paternal Aunt 35       died in 53's  . Breast cancer Paternal Aunt 13  The patient's father died at age 21, from pancreatic cancer diagnosed 2 years before. The patient's mother is age 84 as of June 2018. The patient had 4 brothers, no sisters. 4 maternal aunts had breast cancer at various ages. The patient's father died at age  62 HISTORY:  No LMP recorded. Patient has had a hysterectomy. Menarche age 38, first live birth age 89. The patient is GX P2. She stopped having periods in 2010. 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 Her husband [name] is disabled secondary to low back problems. Daughter [name] lives in [place] and is an Therapist, sports. Daughter [name] also lives in [place], [place] where she works in a call center. The patient has 10 grandchildren and one on the way. She attends [employer] in [place]: Not in place  HEALTH MAINTENANCE: Social History   Tobacco Use  . Smoking status: Never Smoker  . Smokeless tobacco: Never Used  Substance Use Topics  . Alcohol use: No  . Drug use: No     Colonoscopy: UTD/[facility]  PAP:  Bone density:   No Known Allergies  Current Outpatient Medications  Medication Sig Dispense Refill  . amLODipine (NORVASC) 5 MG tablet Take 5 mg by mouth daily.  7  . anastrozole (ARIMIDEX) 1 MG tablet TAKE 1 TABLET BY MOUTH EVERY DAY 90 tablet 4  . doxazosin (CARDURA) 2 MG tablet Take 2 mg by mouth daily.  11  . hydrocortisone 2.5 % cream Apply 1 application topically daily as needed (skin  redness).    Marland Kitchen ketoconazole (NIZORAL) 2 % cream Apply 1 application topically daily as needed for irritation.    Marland Kitchen losartan-hydrochlorothiazide (HYZAAR) 100-25 MG tablet Take 1 tablet by mouth daily.     No current facility-administered medications for this visit.     OBJECTIVE: Morbidly obese African-American woman in no acute distress  Vitals:   09/03/18 1145  BP: (!) 154/67  Pulse: (!) 56  Resp: 20  Temp: 98.5 F (36.9 C)  SpO2: 98%     Body mass index is 61.07 kg/m.   Filed Weights   09/03/18 1145  Weight: (!) 367 lb (166.5 kg)      ECOG FS:1 - Symptomatic but completely ambulatory  Sclerae unicteric, EOMs intact Wearing a mask No cervical or supraclavicular adenopathy Lungs no rales or rhonchi Heart regular rate and rhythm Abd soft, nontender, positive bowel sounds MSK no focal spinal tenderness, no upper extremity lymphedema Neuro: nonfocal, well oriented, appropriate affect Breasts: Status post bilateral reduction mammoplasty's.  On the right she is also status post lumpectomy and radiation.  There is  no evidence of local recurrence.  Both axillae are benign.    LAB RESULTS:  CMP     Component Value Date/Time   NA 142 09/16/2016 1016   K 4.6 09/16/2016 1016   CL 103 08/12/2016 1013   CO2 28 09/16/2016 1016   GLUCOSE 98 09/16/2016 1016   BUN 18.8 09/16/2016 1016   CREATININE 0.8 09/16/2016 1016   CALCIUM 10.0 09/16/2016 1016   PROT 7.1 09/16/2016 1016   ALBUMIN 2.8 (L) 09/16/2016 1016   AST 9 09/16/2016 1016   ALT 12 09/16/2016 1016   ALKPHOS 88 09/16/2016 1016   BILITOT 0.25 09/16/2016 1016   GFRNONAA >60 08/12/2016 1013   GFRAA >60 08/12/2016 1013    No results found for: TOTALPROTELP, ALBUMINELP, A1GS, A2GS, BETS, BETA2SER, GAMS, MSPIKE, SPEI  No results found for: KPAFRELGTCHN, LAMBDASER, KAPLAMBRATIO  Lab Results  Component Value Date   WBC 9.1 09/16/2016   NEUTROABS 6.2 09/16/2016   HGB 9.1 (L) 09/16/2016   HCT 30.2 (L) 09/16/2016    MCV 73.5 (L) 09/16/2016   PLT 397 09/16/2016      Chemistry      Component Value Date/Time   NA 142 09/16/2016 1016   K 4.6 09/16/2016 1016   CL 103 08/12/2016 1013   CO2 28 09/16/2016 1016   BUN 18.8 09/16/2016 1016   CREATININE 0.8 09/16/2016 1016      Component Value Date/Time   CALCIUM 10.0 09/16/2016 1016   ALKPHOS 88 09/16/2016 1016   AST 9 09/16/2016 1016   ALT 12 09/16/2016 1016   BILITOT 0.25 09/16/2016 1016       No results found for: LABCA2  No components found for: [id number]  No results for input(s): INR in the last 168 hours.  Urinalysis No results found for: COLORURINE, APPEARANCEUR, LABSPEC, [place], GLUCOSEU, HGBUR, BILIRUBINUR, KETONESUR, PROTEINUR, UROBILINOGEN, NITRITE, LEUKOCYTESUR   STUDIES: Mm Diag Breast Tomo Bilateral  Result Date: 08/28/2018 CLINICAL DATA:  Routine annual surveillance status post right breast lumpectomy in 2018. EXAM: DIGITAL DIAGNOSTIC BILATERAL MAMMOGRAM WITH CAD AND TOMO COMPARISON:  Previous exam(s). ACR Breast Density Category a: The breast tissue is almost entirely fatty. FINDINGS: The right breast lumpectomy site is stable. No suspicious calcifications, masses or areas of distortion are seen in the bilateral breasts. Mammographic images were processed with CAD. IMPRESSION: Stable right breast lumpectomy site. No mammographic evidence of malignancy in the bilateral breasts. RECOMMENDATION: Diagnostic mammogram is suggested in 1 year. (Code:[id number]-B-01Y) I have discussed the findings and recommendations with the patient. Results were also provided in writing at the conclusion of the visit. If applicable, a reminder letter will be sent to the patient regarding the next appointment. BI-RADS CATEGORY  2: Benign. Electronically Signed   By: Ammie Ferrier M.D.   On: 08/28/2018 15:59    ELIGIBLE FOR AVAILABLE RESEARCH PROTOCOL: no  ASSESSMENT: 58 y.o. [place] woman status post right breast upper outer quadrant biopsy 07/06/2016 for  an invasive ductal carcinoma, grade 2, with insufficient material for a prognostic panel, in the setting of ductal carcinoma in situ, which was estrogen and progesterone receptor negative  (1) status post right lumpectomy with sentinel lymph node sampling 08/18/2016 showing ductal carcinoma in situ, 0.9 cm, with negative margins,; all 3 sentinel lymph nodes were clear.  (a) status post bilateral reduction mammoplasty 08/26/2016 with negative pathology  (2) as the invasive component was <0.5 Oncotype was not requested  (3) adjuvant radiation completed 11/01/2016 - 12/08/2016 Site/dose:  Right Breast, 50.4 Gy total  delivered in 28 fractions  (4) genetics testing 07/26/2016  through the [facility] Hereditary Breast Cancer STAT panel found no deleterious mutations in:  ATM, BRCA1, BRCA2, CDH1, CHEK2, PALB2, PTEN, STK11 and TP53.    (a) the patient declined testing through the common hereditary cancer panel (46 genes)  (5) anastrozole started January 2019   PLAN: Lera is now a little over 2 years out from definitive surgery for breast cancer with no evidence of disease recurrence.  This is very favorable.  She is tolerating anastrozole well and the plan will be to continue that a total of a year.  For her insomnia I recommend that she give Benadryl a try  She is keeping appropriate pandemic precautions  She knows to call for any other issues that may develop before the next visit.  Magrinat, Virgie Dad, MD  09/03/18 12:11 PM Medical Oncology and Hematology [facility] [address] [place], [place] [address] Tel. [phone]    Fax. [phone]   I, Jacqualyn Posey am acting as a Education administrator for Chauncey Cruel, MD.   I, Lurline Del MD, have reviewed the above documentation for accuracy and completeness, and I agree with the above.

## 2019-02-24 ENCOUNTER — Other Ambulatory Visit: Payer: Self-pay | Admitting: Oncology

## 2019-04-23 ENCOUNTER — Ambulatory Visit: Payer: BC Managed Care – PPO | Admitting: Family Medicine

## 2019-04-30 IMAGING — MG STEREOTACTIC VACUUM ASSIST RIGHT
8 of 15 series · 8 of 19 positions shown · non-contrast
Comparison: Previous exams.

ADDENDUM:
Pathology revealed a small focus of grade II to III invasive ductal
carcinoma and grade III ductal carcinoma in situ in the RIGHT
breast. This was found to be concordant by Dr. Jinquan Azzuan. Pathology
results were discussed with the patient by telephone. The patient
reported doing well after the biopsy. Post biopsy instructions and
care were reviewed and questions were answered. The patient was
encouraged to call The [REDACTED] for any
additional concerns. The patient was referred to [REDACTED] [REDACTED] at the [HOSPITAL] [HOSPITAL]
on July 20, 2016.

Pathology results reported by Cameleoncaffe Sepelj RN, BSN on 07/08/2016.
CLINICAL DATA: 56-year-old female for tissue sampling of
calcifications within the upper-outer right breast.
EXAM:
RIGHT BREAST STEREOTACTIC CORE NEEDLE BIOPSY

[R (1 of 8)]
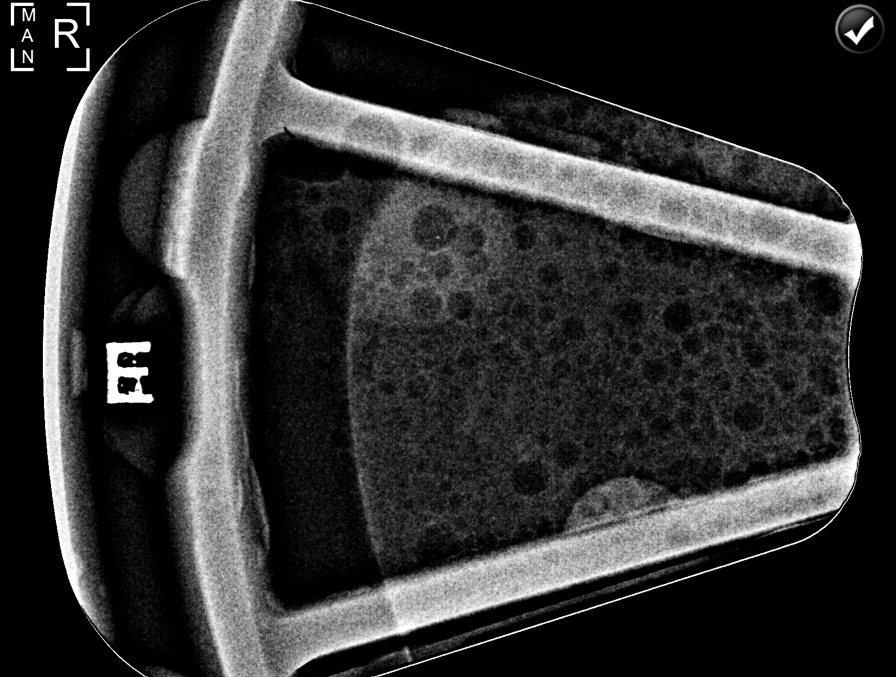

[R (2 of 8)]
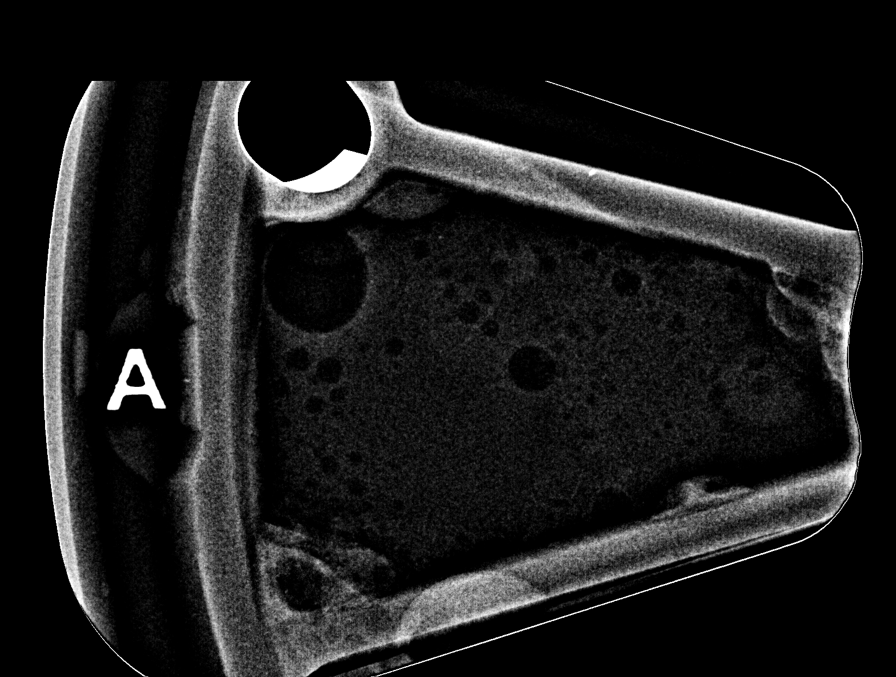

[R (3 of 8)]
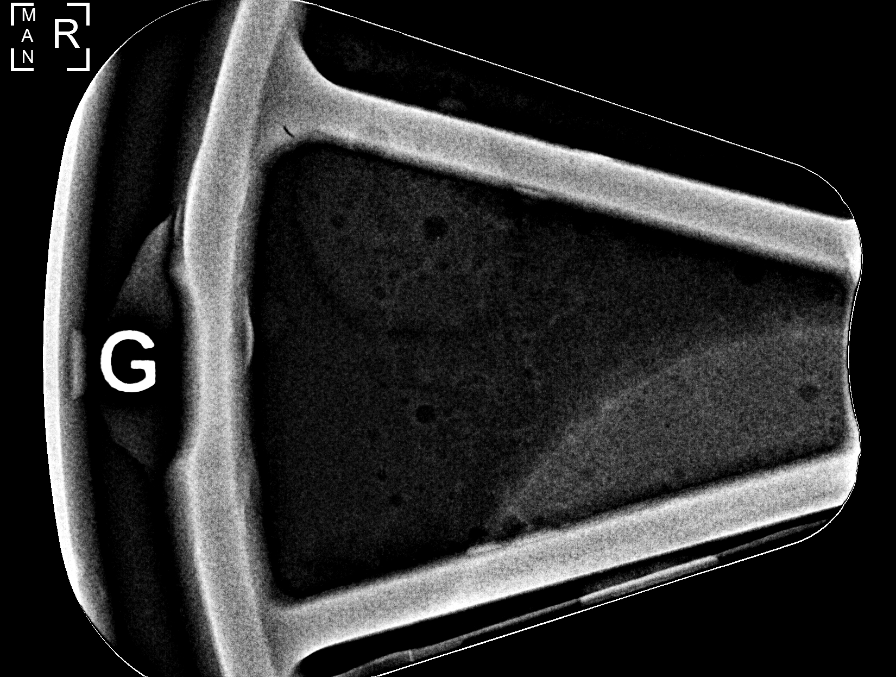

[R (4 of 8)]
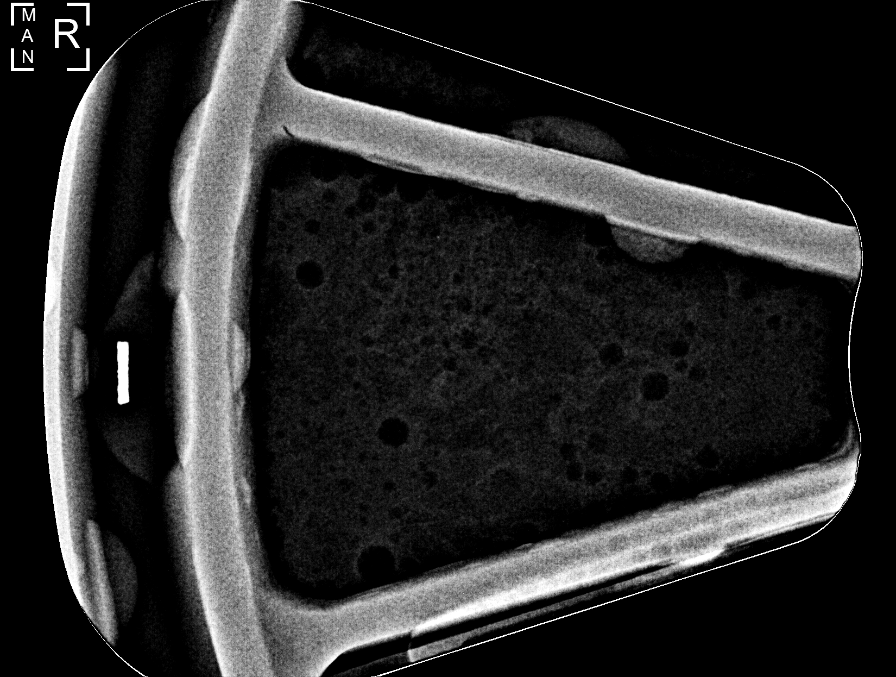

[R (5 of 8)]
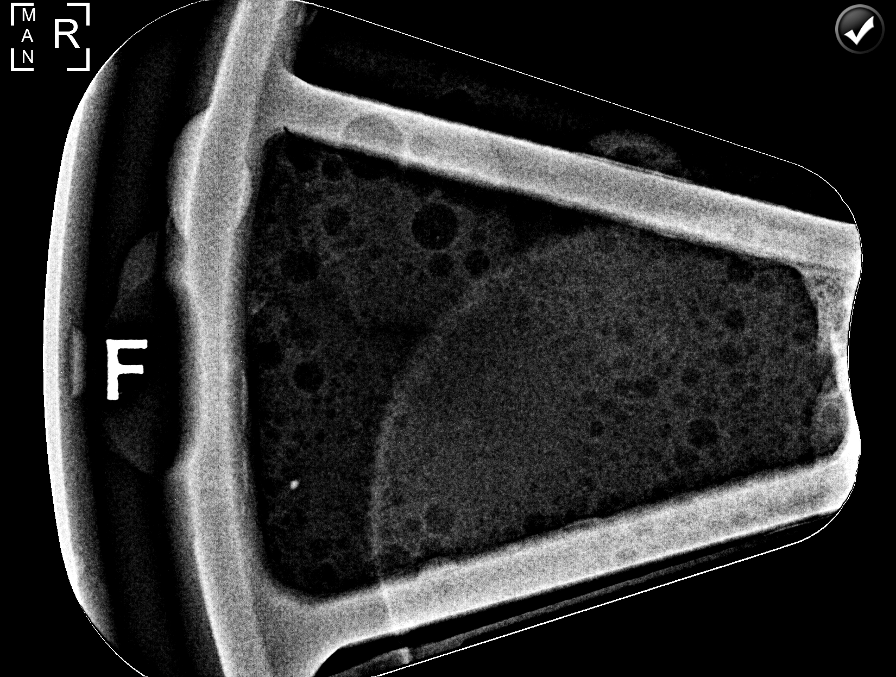

[R (6 of 8)]
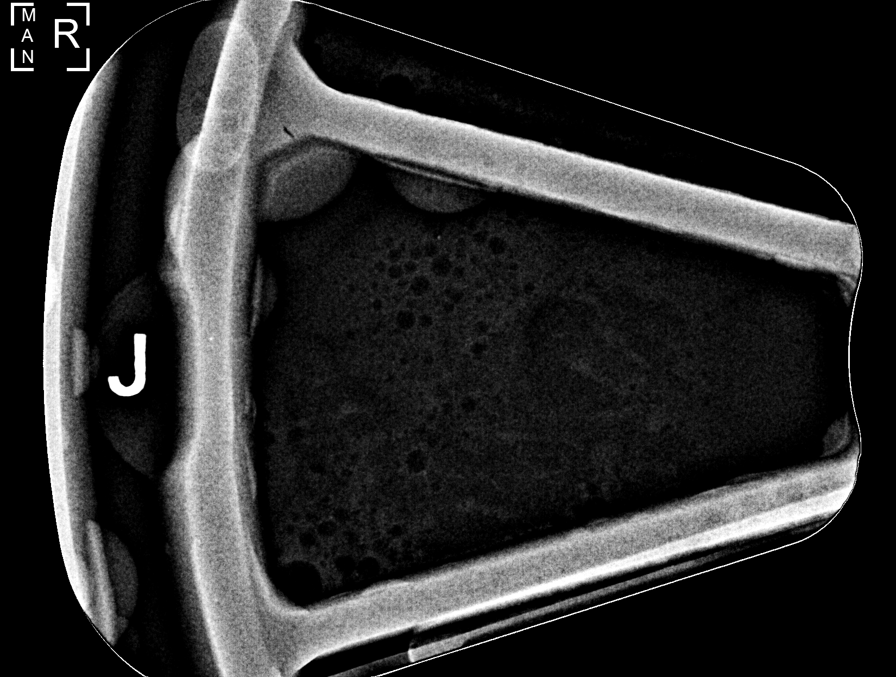

[R (7 of 8)]
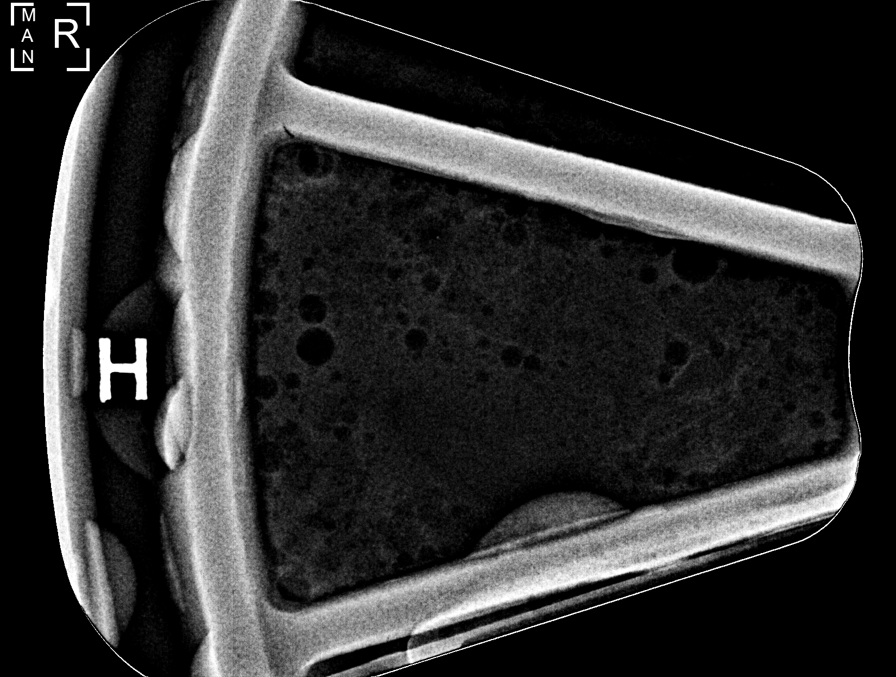

[R (8 of 8)]
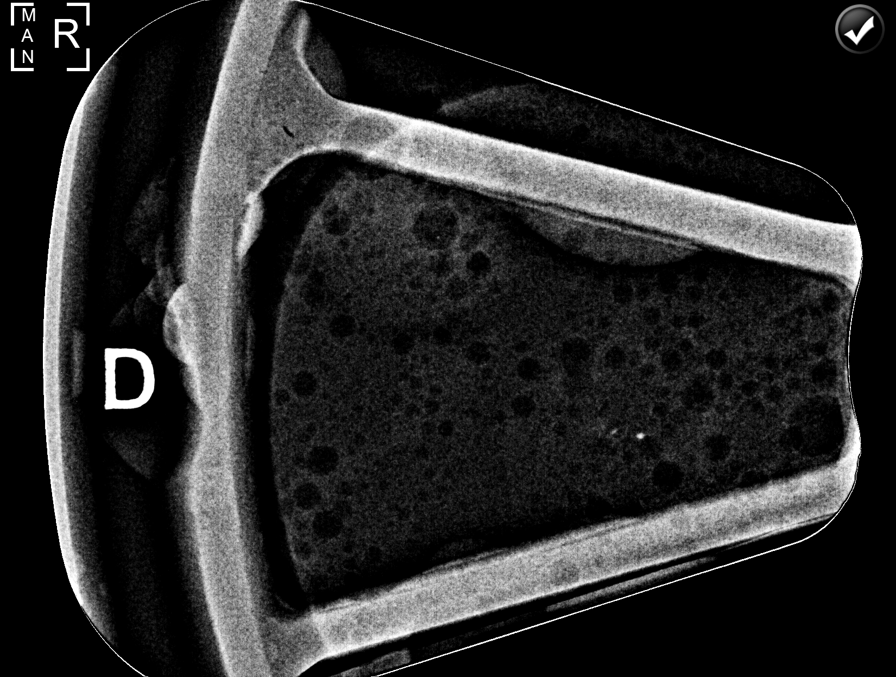

[8 of 19 positions shown; findings below may reference images not displayed]



Using sterile technique and 1% Lidocaine as local anesthetic, under
stereotactic guidance, a 9 gauge vacuum assisted device was used to
perform core needle biopsy of calcifications in the upper-outer
quadrant of the right breast using a superior approach. Specimen
radiograph was performed showing calcifications. Specimens with
calcifications are identified for pathology.

Lesion quadrant: Upper outer quadrant

At the conclusion of the procedure, a coil shaped tissue marker clip
was deployed into the biopsy cavity. Follow-up 2-view mammogram was
performed and dictated separately.
IMPRESSION: Stereotactic-guided biopsy of calcifications within the upper-outer
right breast. No apparent complications.

## 2019-07-08 ENCOUNTER — Other Ambulatory Visit: Payer: Self-pay | Admitting: Oncology

## 2019-07-08 DIAGNOSIS — Z853 Personal history of malignant neoplasm of breast: Secondary | ICD-10-CM

## 2019-08-27 ENCOUNTER — Other Ambulatory Visit: Payer: Self-pay | Admitting: Oncology

## 2019-08-29 ENCOUNTER — Ambulatory Visit
Admission: RE | Admit: 2019-08-29 | Discharge: 2019-08-29 | Disposition: A | Discharge status: Home / Self Care | Payer: 59 | Source: Ambulatory Visit | Attending: Oncology | Admitting: Oncology

## 2019-08-29 ENCOUNTER — Other Ambulatory Visit: Payer: Self-pay

## 2019-08-29 DIAGNOSIS — Z853 Personal history of malignant neoplasm of breast: Secondary | ICD-10-CM

## 2019-09-02 ENCOUNTER — Telehealth: Payer: Self-pay | Admitting: Oncology

## 2019-09-02 NOTE — Telephone Encounter (Signed)
Rescheduled appts per provider PAL and print out with hand written instructions. Left voicemail with cancellation and new appt details.  

## 2019-09-17 ENCOUNTER — Ambulatory Visit: Payer: BC Managed Care – PPO | Admitting: Oncology

## 2019-09-19 ENCOUNTER — Inpatient Hospital Stay: Payer: 59 | Attending: Oncology | Admitting: Adult Health

## 2019-09-19 ENCOUNTER — Other Ambulatory Visit: Payer: Self-pay

## 2019-09-19 VITALS — BP 168/70 | HR 59 | Temp 97.1°F | Resp 20 | Ht 65.0 in | Wt 380.7 lb

## 2019-09-19 DIAGNOSIS — G479 Sleep disorder, unspecified: Secondary | ICD-10-CM | POA: Diagnosis not present

## 2019-09-19 DIAGNOSIS — C50411 Malignant neoplasm of upper-outer quadrant of right female breast: Secondary | ICD-10-CM | POA: Diagnosis not present

## 2019-09-19 DIAGNOSIS — E669 Obesity, unspecified: Secondary | ICD-10-CM | POA: Insufficient documentation

## 2019-09-19 DIAGNOSIS — D0511 Intraductal carcinoma in situ of right breast: Secondary | ICD-10-CM | POA: Diagnosis not present

## 2019-09-19 DIAGNOSIS — Z800 Family history of malignant neoplasm of digestive organs: Secondary | ICD-10-CM | POA: Diagnosis not present

## 2019-09-19 DIAGNOSIS — Z79899 Other long term (current) drug therapy: Secondary | ICD-10-CM | POA: Insufficient documentation

## 2019-09-19 DIAGNOSIS — Z803 Family history of malignant neoplasm of breast: Secondary | ICD-10-CM | POA: Diagnosis not present

## 2019-09-19 DIAGNOSIS — R232 Flushing: Secondary | ICD-10-CM | POA: Insufficient documentation

## 2019-09-19 DIAGNOSIS — I10 Essential (primary) hypertension: Secondary | ICD-10-CM | POA: Diagnosis not present

## 2019-09-19 DIAGNOSIS — Z171 Estrogen receptor negative status [ER-]: Secondary | ICD-10-CM

## 2019-09-19 DIAGNOSIS — K219 Gastro-esophageal reflux disease without esophagitis: Secondary | ICD-10-CM | POA: Diagnosis not present

## 2019-09-19 NOTE — Progress Notes (Signed)
[place]  Telephone:([phone]) [phone] Fax:(336) [phone]     ID: Jodi Mccormick DOB: [birth year]  MR#: [id number]  TWK#:[id number]  Patient Care Team: Loraine Leriche., MD as PCP - General (Internal Medicine) Excell Seltzer, MD (Inactive) as Consulting Physician (General Surgery) Magrinat, Virgie Dad, MD as Consulting Physician (Oncology) Gery Pray, MD as Consulting Physician (Radiation Oncology) OTHER MD:  CHIEF COMPLAINT: Invasive ductal carcinoma of the right breast  CURRENT TREATMENT:  anastrozole    BREAST CANCER HISTORY: From the original intake note:  The patient had screening mammography in [facility] suggesting a change in her right breast. She was referred to the [facility] where 07/06/2016 she underwent stereotactic biopsy of the upper-outer quadrant of the right breast, which showed ([id number]) an invasive ductal carcinoma, grade 2 or 3, in the setting of ductal carcinoma in situ, grade 3. There was not enough invasive tumor to do a prognostic panel but the noninvasive breast cancer was estrogen and progesterone receptor negative.  Her subsequent history is as detailed below   INTERVAL HISTORY: Jodi Mccormick returns today for follow-up and treatment of her estrogen receptor negative breast cancer.  She continues on anastrozole. She has hot flashes; she states that she has some mild difficulty sleeping due to this.  There is no bone density screening on file.  She has undergone bilateral diagnostic mammogram on 08/28/2019 which showed lumpectomy site calcifications, and recommended right breast mammogram in 02/2020.    REVIEW OF SYSTEMS: Jodi Mccormick continues to f/u with her PCP and remains active.  She denies any new concerns such as fever, chills, chest pain, palpitations, cough, shortness of breath, nausea, vomiting, bowel/blader changes, headaches, vision issues, new pain, bloating, dysphagia, or any other concerns.  A detailed ROS was otherwise non  contributory.     PAST MEDICAL HISTORY: Past Medical History:  Diagnosis Date  . Anemia   . Arthritis    "knees" (08/26/2016)  . Estrogen receptor negative status (ER-)   . Family history of breast cancer 07/20/2016  . Family history of pancreatic cancer 07/20/2016  . GERD (gastroesophageal reflux disease)   . Heart murmur   . Hypertension   . Malignant neoplasm of upper-outer quadrant of right female breast ([place])   . Obesity   . OSA on CPAP   . Personal history of radiation therapy     PAST SURGICAL HISTORY: Past Surgical History:  Procedure Laterality Date  . ABDOMINAL HYSTERECTOMY    . BREAST BIOPSY Right 07/06/2016; 08/18/2016  . BREAST LUMPECTOMY Right 2018  . BREAST LUMPECTOMY WITH RADIOACTIVE SEED AND SENTINEL LYMPH NODE BIOPSY Right 08/18/2016   Procedure: RADIOACTIVE SEED GUIDED RIGHT BREAST LUMPECTOMY, RIGHT AXILLARY SENTINEL LYMPH NODE BIOPSY.;  Surgeon: Excell Seltzer, MD;  Location: [facility];  Service: General;  Laterality: Right;  . BREAST REDUCTION SURGERY Bilateral 08/26/2016   Procedure: right oncoplastic MAMMARY REDUCTION  (BREAST) and left breast reduction for symmetry with bilateral resection of nipple aereola;  Surgeon: [name], MD;  Location: [facility];  Service: Plastics;  Laterality: Bilateral;  . BUNIONECTOMY Left   . DILATION AND CURETTAGE OF UTERUS  1983  . LAPAROSCOPIC CHOLECYSTECTOMY    . REDUCTION MAMMAPLASTY Bilateral 08/26/2016   oncoplastic MAMMARY REDUCTION  (BREAST) and left breast reduction for symmetry with bilateral resection of nipple aereola/notes 08/26/2016  . TUBAL LIGATION  ~2000    FAMILY HISTORY Family History  Problem Relation Age of Onset  . Hypertension Mother   . Leukemia Mother 105  . Hypertension Father   .  Diabetes Father   . Pancreatic cancer Father 7       died 69  . Breast cancer Paternal Aunt 38       died in 59's  . Kidney disease Maternal Grandmother   . Breast cancer Paternal Aunt 79       died in 63's  .  Breast cancer Paternal Aunt 48       died in 64's  . Breast cancer Paternal Aunt 2  The patient's father died at age 73, from pancreatic cancer diagnosed 2 years before. The patient's mother is age 47 as of June 2018. The patient had 4 brothers, no sisters. 4 maternal aunts had breast cancer at various ages. The patient's father died at age  34 HISTORY:  No LMP recorded. Patient has had a hysterectomy. Menarche age 64, first live birth age 89. The patient is GX P2. She stopped having periods in 2010. 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 Her husband [name] is disabled secondary to low back problems. Daughter [name] lives in [place] and is an Therapist, sports. Daughter [name] also lives in [place], [place] where she works in a call center. The patient has 10 grandchildren and one on the way. She attends [employer] in [place]: Not in place  HEALTH MAINTENANCE: Social History   Tobacco Use  . Smoking status: Never Smoker  . Smokeless tobacco: Never Used  Vaping Use  . Vaping Use: Never used  Substance Use Topics  . Alcohol use: No  . Drug use: No     Colonoscopy: UTD/[facility]  PAP:  Bone density:   No Known Allergies  Current Outpatient Medications  Medication Sig Dispense Refill  . amLODipine (NORVASC) 5 MG tablet Take 5 mg by mouth daily.  7  . anastrozole (ARIMIDEX) 1 MG tablet TAKE 1 TABLET BY MOUTH EVERY DAY 90 tablet 4  . doxazosin (CARDURA) 2 MG tablet Take 2 mg by mouth daily.  11  . hydrochlorothiazide (HYDRODIURIL) 25 MG tablet Take 25 mg by mouth daily.    . hydrocortisone 2.5 % cream Apply 1 application topically daily as needed (skin redness).    Marland Kitchen ketoconazole (NIZORAL) 2 % cream Apply 1 application topically daily as needed for  irritation.    Marland Kitchen losartan-hydrochlorothiazide (HYZAAR) 100-25 MG tablet Take 1 tablet by mouth daily.    . meloxicam (MOBIC) 15 MG tablet Take 15 mg by mouth daily.     No current facility-administered medications for this visit.    OBJECTIVE: Morbidly obese African-American woman in no acute distress  Vitals:   09/19/19 1328  BP: (!) 168/70  Pulse: (!) 59  Resp: 20  Temp: (!) 97.1 F (36.2 C)  SpO2: 100%     Body mass index is 63.35 kg/m.   Filed Weights   09/19/19 1328  Weight: (!) 380 lb 11.2 oz (172.7 kg)      ECOG FS:1 - Symptomatic but completely ambulatory  GENERAL: Patient is a well appearing female in no acute distress HEENT:  Sclerae anicteric.  Mask in place. Neck is supple.  NODES:  No cervical, supraclavicular, or axillary lymphadenopathy palpated.  BREAST EXAM:  Right breast s/p lumpectomy and radiation, left breast benign LUNGS:  Clear to auscultation bilaterally.  No wheezes or rhonchi. HEART:  Regular rate and  rhythm. No murmur appreciated. ABDOMEN:  Soft, nontender.  Positive, normoactive bowel sounds. No organomegaly palpated. MSK:  No focal spinal tenderness to palpation. Full range of motion bilaterally in the upper extremities. EXTREMITIES:  No peripheral edema.   SKIN:  Clear with no obvious rashes or skin changes. No nail dyscrasia. NEURO:  Nonfocal. Well oriented.  Appropriate affect.     LAB RESULTS:  CMP     Component Value Date/Time   NA 142 09/16/2016 1016   K 4.6 09/16/2016 1016   CL 103 08/12/2016 1013   CO2 28 09/16/2016 1016   GLUCOSE 98 09/16/2016 1016   BUN 18.8 09/16/2016 1016   CREATININE 0.8 09/16/2016 1016   CALCIUM 10.0 09/16/2016 1016   PROT 7.1 09/16/2016 1016   ALBUMIN 2.8 (L) 09/16/2016 1016   AST 9 09/16/2016 1016   ALT 12 09/16/2016 1016   ALKPHOS 88 09/16/2016 1016   BILITOT 0.25 09/16/2016 1016   GFRNONAA >60 08/12/2016 1013   GFRAA >60 08/12/2016 1013    No results found for: TOTALPROTELP, ALBUMINELP,  A1GS, A2GS, BETS, BETA2SER, GAMS, MSPIKE, SPEI  No results found for: KPAFRELGTCHN, LAMBDASER, KAPLAMBRATIO  Lab Results  Component Value Date   WBC 9.1 09/16/2016   NEUTROABS 6.2 09/16/2016   HGB 9.1 (L) 09/16/2016   HCT 30.2 (L) 09/16/2016   MCV 73.5 (L) 09/16/2016   PLT 397 09/16/2016      Chemistry      Component Value Date/Time   NA 142 09/16/2016 1016   K 4.6 09/16/2016 1016   CL 103 08/12/2016 1013   CO2 28 09/16/2016 1016   BUN 18.8 09/16/2016 1016   CREATININE 0.8 09/16/2016 1016      Component Value Date/Time   CALCIUM 10.0 09/16/2016 1016   ALKPHOS 88 09/16/2016 1016   AST 9 09/16/2016 1016   ALT 12 09/16/2016 1016   BILITOT 0.25 09/16/2016 1016       No results found for: LABCA2  No components found for: DXIPJA250  No results for input(s): INR in the last 168 hours.  Urinalysis No results found for: COLORURINE, APPEARANCEUR, LABSPEC, PHURINE, GLUCOSEU, HGBUR, BILIRUBINUR, KETONESUR, PROTEINUR, UROBILINOGEN, NITRITE, LEUKOCYTESUR   STUDIES: MM DIAG BREAST TOMO BILATERAL  Addendum Date: 08/30/2019   ADDENDUM REPORT: 08/30/2019 08:34 ADDENDUM: Addendum for addition of recommendation time frame. Recommendation: Right breast diagnostic mammography with magnification views in 6 months to reassess the lumpectomy site calcifications, favored to represent fat necrosis. Electronically Signed   By: Lovey Newcomer M.D.   On: 08/30/2019 08:34   Result Date: 08/30/2019 CLINICAL DATA:  Patient with history right breast lumpectomy 2018. EXAM: DIGITAL DIAGNOSTIC BILATERAL MAMMOGRAM WITH TOMO AND CAD COMPARISON:  Previous exam(s). ACR Breast Density Category a: The breast tissue is almost entirely fatty. FINDINGS: There are calcifications at the lumpectomy site which likely represent interval fat necrosis. No additional new masses, calcifications or nonsurgical distortion identified within the right or left breast. Post reduction changes identified in both breast. Mammographic  images were processed with CAD. IMPRESSION: Calcifications at the right breast lumpectomy site likely represent fat necrosis. RECOMMENDATION: Right breast diagnostic mammography and magnification views to reassess the lumpectomy site calcifications, favored to represent fat necrosis. I have discussed the findings and recommendations with the patient. If applicable, a reminder letter will be sent to the patient regarding the next appointment. BI-RADS CATEGORY  3: Probably benign. Electronically Signed: By: Lovey Newcomer M.D. On: 08/29/2019 15:54    ELIGIBLE FOR AVAILABLE RESEARCH PROTOCOL: no  ASSESSMENT:  59 y.o. [place] woman status post right breast upper outer quadrant biopsy 07/06/2016 for an invasive ductal carcinoma, grade 2, with insufficient material for a prognostic panel, in the setting of ductal carcinoma in situ, which was estrogen and progesterone receptor negative  (1) status post right lumpectomy with sentinel lymph node sampling 08/18/2016 showing ductal carcinoma in situ, 0.9 cm, with negative margins,; all 3 sentinel lymph nodes were clear.  (a) status post bilateral reduction mammoplasty 08/26/2016 with negative pathology  (2) as the invasive component was <0.5 Oncotype was not requested  (3) adjuvant radiation completed 11/01/2016 - 12/08/2016 Site/dose:  Right Breast, 50.4 Gy total delivered in 28 fractions  (4) genetics testing 07/26/2016  through the [facility] Hereditary Breast Cancer STAT panel found no deleterious mutations in:  ATM, BRCA1, BRCA2, CDH1, CHEK2, PALB2, PTEN, STK11 and TP53.    (a) the patient declined testing through the common hereditary cancer panel (46 genes)  (5) anastrozole started January 2019   PLAN: Jodi Mccormick is here today for f/u of her right sided DCIS s/p lumpectomy, adjuvant radiation therapy and currently taking anastrozole daily.  She has no clinical or radiographic sign of breast cancer recurrence.  She continues on anastrozole daily and  tolerates it well.  She will continue this.  She is due for repeat mammogram in 02/2020.    We discussed bone health and bone density testing today.    We discussed her keeping up with her PCP visits and health maintenance in regards to annual pelvic exams, pap smears when due, skin exams, and colon cancer screening.    I recommended healthy diet and exercise as she is able.    We will see Jodi Mccormick back on an annual basis for f/u.  She knows to call for any questions that may arise between now and her next appointment.  We are happy to see her sooner if needed.    Total encounter time: 20 minutes*  Wilber Bihari, NP 09/19/19 1:51 PM Medical Oncology and Hematology [facility] [place], [place] [address] Tel. [phone]    Fax. [phone]  *Total Encounter Time as defined by the Centers for Medicare and Medicaid Services includes, in addition to the face-to-face time of a patient visit (documented in the note above) non-face-to-face time: obtaining and reviewing outside history, ordering and reviewing medications, tests or procedures, care coordination (communications with other health care professionals or caregivers) and documentation in the medical record.

## 2020-01-27 ENCOUNTER — Other Ambulatory Visit: Payer: Self-pay | Admitting: Oncology

## 2020-01-27 DIAGNOSIS — R921 Mammographic calcification found on diagnostic imaging of breast: Secondary | ICD-10-CM

## 2020-03-04 ENCOUNTER — Other Ambulatory Visit: Payer: Self-pay | Admitting: Oncology

## 2020-03-04 ENCOUNTER — Ambulatory Visit
Admission: RE | Admit: 2020-03-04 | Discharge: 2020-03-04 | Disposition: A | Discharge status: Home / Self Care | Payer: 59 | Source: Ambulatory Visit | Attending: Oncology | Admitting: Oncology

## 2020-03-04 ENCOUNTER — Other Ambulatory Visit: Payer: Self-pay

## 2020-03-04 DIAGNOSIS — R921 Mammographic calcification found on diagnostic imaging of breast: Secondary | ICD-10-CM

## 2020-03-26 ENCOUNTER — Ambulatory Visit: Payer: 59 | Admitting: Family Medicine

## 2020-03-26 ENCOUNTER — Other Ambulatory Visit: Payer: Self-pay

## 2020-03-26 VITALS — BP 170/100 | Ht 66.0 in | Wt 384.0 lb

## 2020-03-26 DIAGNOSIS — M5416 Radiculopathy, lumbar region: Secondary | ICD-10-CM

## 2020-03-26 MED ORDER — PREDNISONE 5 MG PO TABS: ORAL_TABLET | ORAL | 0 refills | Status: DC

## 2020-03-26 NOTE — Assessment & Plan Note (Signed)
Symptoms seem more suggestive of radicular problems at this time. -Counseled on home exercise therapy and supportive care. -Prednisone. -Could consider further imaging or physical therapy.

## 2020-03-26 NOTE — Patient Instructions (Signed)
Nice to meet you Please try the exercises  Please try heat   Please send me a message in MyChart with any questions or updates.  Please see me back in 4 weeks or sooner if needed.   --Dr. Raeford Razor

## 2020-03-26 NOTE — Progress Notes (Signed)
Jodi Mccormick - 60 y.o. female MRN [id number]  Date of birth: 04/30/1960  SUBJECTIVE:  Including CC & ROS.  No chief complaint on file.   Jodi Mccormick is a 60 y.o. female that is presenting with right leg pain.  The pain has been ongoing for about a month.  No history of similar type pain.  She did see her back doctor who x-rayed her back.  This was not demonstrating any significant changes.  Pain is severe.  She does have right-sided lateral pain that extends from her hip to her foot.   Review of Systems See HPI   HISTORY: Past Medical, Surgical, Social, and Family History Reviewed & Updated per EMR.   Pertinent Historical Findings include:  Past Medical History:  Diagnosis Date  . Anemia   . Arthritis    "knees" (08/26/2016)  . Estrogen receptor negative status (ER-)   . Family history of breast cancer 07/20/2016  . Family history of pancreatic cancer 07/20/2016  . GERD (gastroesophageal reflux disease)   . Heart murmur   . Hypertension   . Malignant neoplasm of upper-outer quadrant of right female breast ([facility])   . Obesity   . OSA on CPAP   . Personal history of radiation therapy     Past Surgical History:  Procedure Laterality Date  . ABDOMINAL HYSTERECTOMY    . BREAST BIOPSY Right 07/06/2016; 08/18/2016  . BREAST LUMPECTOMY Right 2018  . BREAST LUMPECTOMY WITH RADIOACTIVE SEED AND SENTINEL LYMPH NODE BIOPSY Right 08/18/2016   Procedure: RADIOACTIVE SEED GUIDED RIGHT BREAST LUMPECTOMY, RIGHT AXILLARY SENTINEL LYMPH NODE BIOPSY.;  Surgeon: Excell Seltzer, MD;  Location: [facility];  Service: General;  Laterality: Right;  . BREAST REDUCTION SURGERY Bilateral 08/26/2016   Procedure: right oncoplastic MAMMARY REDUCTION  (BREAST) and left breast reduction for symmetry with bilateral resection of nipple aereola;  Surgeon: [name], MD;  Location: [facility];  Service: Plastics;  Laterality: Bilateral;  . BUNIONECTOMY Left   . DILATION AND CURETTAGE OF UTERUS  1983  .  LAPAROSCOPIC CHOLECYSTECTOMY    . REDUCTION MAMMAPLASTY Bilateral 08/26/2016   oncoplastic MAMMARY REDUCTION  (BREAST) and left breast reduction for symmetry with bilateral resection of nipple aereola/notes 08/26/2016  . TUBAL LIGATION  ~2000    Family History  Problem Relation Age of Onset  . Hypertension Mother   . Leukemia Mother 4  . Hypertension Father   . Diabetes Father   . Pancreatic cancer Father 60       died 64  . Breast cancer Paternal Aunt 14       died in 47's  . Kidney disease Maternal Grandmother   . Breast cancer Paternal Aunt 66       died in 66's  . Breast cancer Paternal Aunt 35       died in 39's  . Breast cancer Paternal Aunt 97    Social History   Socioeconomic History  . Marital status: Married    Spouse name: Not on file  . Number of children: 2  . Years of education: Not on file  . Highest education level: Not on file  Occupational History  . Not on file  Tobacco Use  . Smoking status: Never Smoker  . Smokeless tobacco: Never Used  Vaping Use  . Vaping Use: Never used  Substance and Sexual Activity  . Alcohol use: No  . Drug use: No  . Sexual activity: Yes    Birth control/protection: Surgical  Other Topics Concern  . Not  on file  Social History Narrative  . Not on file   Social Determinants of Health   Financial Resource Strain: Not on file  Food Insecurity: Not on file  Transportation Needs: Not on file  Physical Activity: Not on file  Stress: Not on file  Social Connections: Not on file  Intimate Partner Violence: Not on file     PHYSICAL EXAM:  VS: BP (!) 170/100   Ht 5\' 6"  (1.676 m)   Wt (!) 384 lb (174.2 kg)   BMI 61.98 kg/m  Physical Exam Gen: NAD, alert, cooperative with exam, well-appearing MSK:  Back: Normal range of motion. Normal strength resistance. Negative straight leg raise. Neurovascular intact     ASSESSMENT & PLAN:   Lumbar radiculopathy Symptoms seem more suggestive of radicular problems at  this time. -Counseled on home exercise therapy and supportive care. -Prednisone. -Could consider further imaging or physical therapy.

## 2020-04-06 ENCOUNTER — Encounter: Payer: Self-pay | Admitting: Family Medicine

## 2020-04-08 ENCOUNTER — Other Ambulatory Visit: Payer: Self-pay | Admitting: Family Medicine

## 2020-04-08 MED ORDER — GABAPENTIN 100 MG PO CAPS: 100.0000 mg | ORAL_CAPSULE | Freq: Three times a day (TID) | ORAL | 1 refills | Status: DC

## 2020-04-08 NOTE — Progress Notes (Signed)
Provided gabapentin.   Rosemarie Ax, MD [facility] 04/08/2020, 7:54 AM

## 2020-04-18 ENCOUNTER — Other Ambulatory Visit: Payer: Self-pay | Admitting: Oncology

## 2020-04-22 ENCOUNTER — Other Ambulatory Visit: Payer: Self-pay | Admitting: Oncology

## 2020-04-23 ENCOUNTER — Ambulatory Visit: Payer: 59 | Admitting: Family Medicine

## 2020-04-23 ENCOUNTER — Ambulatory Visit: Payer: Self-pay

## 2020-04-23 ENCOUNTER — Ambulatory Visit (HOSPITAL_BASED_OUTPATIENT_CLINIC_OR_DEPARTMENT_OTHER)
Admission: RE | Admit: 2020-04-23 | Discharge: 2020-04-23 | Disposition: A | Discharge status: Home / Self Care | Payer: 59 | Source: Ambulatory Visit | Attending: Family Medicine | Admitting: Family Medicine

## 2020-04-23 ENCOUNTER — Other Ambulatory Visit: Payer: Self-pay

## 2020-04-23 ENCOUNTER — Encounter: Payer: Self-pay | Admitting: Family Medicine

## 2020-04-23 VITALS — BP 152/80 | Ht 66.0 in | Wt 384.0 lb

## 2020-04-23 DIAGNOSIS — M1711 Unilateral primary osteoarthritis, right knee: Secondary | ICD-10-CM

## 2020-04-23 DIAGNOSIS — M5416 Radiculopathy, lumbar region: Secondary | ICD-10-CM | POA: Insufficient documentation

## 2020-04-23 DIAGNOSIS — M25561 Pain in right knee: Secondary | ICD-10-CM

## 2020-04-23 MED ORDER — PENNSAID 2 % EX SOLN: 1.0000 "application " | Freq: Two times a day (BID) | CUTANEOUS | 2 refills | Status: AC

## 2020-04-23 MED ORDER — TRIAMCINOLONE ACETONIDE 40 MG/ML IJ SUSP
40.0000 mg | Freq: Once | INTRAMUSCULAR | Status: AC
  Administered 2020-04-23: 40 mg via INTRA_ARTICULAR

## 2020-04-23 NOTE — Patient Instructions (Signed)
Good to see you Please try ice as needed   Please try the pennsaid on the knee. Do not take with mobic.  I will call with the xray results.  Please use the rolling walker as needed  Please send me a message in MyChart with any questions or updates.  Please see me back in 4 weeks or sooner if needed.   --Dr. Raeford Razor

## 2020-04-23 NOTE — Assessment & Plan Note (Addendum)
Has pain around the knee and the tibia.  Does have inflammatory change of the fat pad as well as the lateral edge of the patellofemoral joint. -Counseled on home exercise therapy and supportive care. -Injection. -X-ray. -Could consider gel injection or physical therapy. -Rollator. -pennsaid  -Provided work note

## 2020-04-23 NOTE — Progress Notes (Signed)
Jodi Mccormick - 61 y.o. female MRN [id number]  Date of birth: January 29, 1961  SUBJECTIVE:  Including CC & ROS.  No chief complaint on file.   Jodi Mccormick is a 60 y.o. female that is presenting with worsening of her right leg pain.  She did get some improvement with the prednisone and the gabapentin.  However she still has significant pain around the knee.  She also feels it in the tibia.   Review of Systems See HPI   HISTORY: Past Medical, Surgical, Social, and Family History Reviewed & Updated per EMR.   Pertinent Historical Findings include:  Past Medical History:  Diagnosis Date   Anemia    Arthritis    "knees" (08/26/2016)   Estrogen receptor negative status (ER-)    Family history of breast cancer 07/20/2016   Family history of pancreatic cancer 07/20/2016   GERD (gastroesophageal reflux disease)    Heart murmur    Hypertension    Malignant neoplasm of upper-outer quadrant of right female breast ([facility])    Obesity    OSA on CPAP    Personal history of radiation therapy     Past Surgical History:  Procedure Laterality Date   ABDOMINAL HYSTERECTOMY     BREAST BIOPSY Right 07/06/2016; 08/18/2016   BREAST LUMPECTOMY Right 2018   BREAST LUMPECTOMY WITH RADIOACTIVE SEED AND SENTINEL LYMPH NODE BIOPSY Right 08/18/2016   Procedure: RADIOACTIVE SEED GUIDED RIGHT BREAST LUMPECTOMY, RIGHT AXILLARY SENTINEL LYMPH NODE BIOPSY.;  Surgeon: Excell Seltzer, MD;  Location: [facility];  Service: General;  Laterality: Right;   BREAST REDUCTION SURGERY Bilateral 08/26/2016   Procedure: right oncoplastic MAMMARY REDUCTION  (BREAST) and left breast reduction for symmetry with bilateral resection of nipple aereola;  Surgeon: [name], MD;  Location: [facility];  Service: Plastics;  Laterality: Bilateral;   BUNIONECTOMY Left    DILATION AND CURETTAGE OF UTERUS  1983   LAPAROSCOPIC CHOLECYSTECTOMY     REDUCTION MAMMAPLASTY Bilateral 08/26/2016   oncoplastic MAMMARY  REDUCTION  (BREAST) and left breast reduction for symmetry with bilateral resection of nipple aereola/notes 08/26/2016   TUBAL LIGATION  ~2000    Family History  Problem Relation Age of Onset   Hypertension Mother    Leukemia Mother 73   Hypertension Father    Diabetes Father    Pancreatic cancer Father 66       died 66   Breast cancer Paternal Aunt 43       died in 39's   Kidney disease Maternal Grandmother    Breast cancer Paternal Aunt 45       died in 58's   Breast cancer Paternal Aunt 40       died in 30's   Breast cancer Paternal Aunt 52    Social History   Socioeconomic History   Marital status: Married    Spouse name: Not on file   Number of children: 2   Years of education: Not on file   Highest education level: Not on file  Occupational History   Not on file  Tobacco Use   Smoking status: Never Smoker   Smokeless tobacco: Never Used  Vaping Use   Vaping Use: Never used  Substance and Sexual Activity   Alcohol use: No   Drug use: No   Sexual activity: Yes    Birth control/protection: Surgical  Other Topics Concern   Not on file  Social History Narrative   Not on file   Social Determinants of Health   Financial Resource Strain:  Not on file  Food Insecurity: Not on file  Transportation Needs: Not on file  Physical Activity: Not on file  Stress: Not on file  Social Connections: Not on file  Intimate Partner Violence: Not on file     PHYSICAL EXAM:  VS: BP (!) 152/80 (BP Location: Right Arm, Patient Position: Sitting, Cuff Size: Large)    Ht 5\' 6"  (1.676 m)    Wt (!) 384 lb (174.2 kg)    BMI 61.98 kg/m  Physical Exam Gen: NAD, alert, cooperative with exam, well-appearing MSK:  Right knee: Moderate effusion. Normal range of motion. Negative McMurray test. Neurovascular intact  Limited ultrasound: Right knee:  Moderate effusion in the suprapatellar pouch. Normal-appearing quadricep patellar tendon. Severe  degenerative changes of the medial compartment. Increased hyperemia within the fat pad and it seems to extend to the lateral retinaculum and the lateral edge of the patella. Lateral joint space with moderate degenerative change.  Summary: Severe OA with fat pad impingement.  Ultrasound and interpretation by Clearance Coots, MD   Aspiration/Injection Procedure Note Jodi Mccormick December 21, 1960  Procedure: Injection Indications: Right knee pain  Procedure Details Consent: Risks of procedure as well as the alternatives and risks of each were explained to the (patient/caregiver).  Consent for procedure obtained. Time Out: Verified patient identification, verified procedure, site/side was marked, verified correct patient position, special equipment/implants available, medications/allergies/relevent history reviewed, required imaging and test results available.  Performed.  The area was cleaned with iodine and alcohol swabs.    The right knee superior lateral suprapatellar pouch was injected using 4 cc of 1% lidocaine on a 22-gauge 3-1/2 inch needle.  The syringe was switched to mixture containing 1 cc's of 40 mg Kenalog and 4 cc's of 0.25% bupivacaine was injected.  Ultrasound was used. Images were obtained in long views showing the injection.     A sterile dressing was applied.  Patient did tolerate procedure well.    ASSESSMENT & PLAN:   Lumbar radiculopathy Does seem to have a component of radicular symptoms down the leg.  Did get improvement with the gabapentin and prednisone. -Counseled on home exercise therapy and supportive care. -X-ray. -Could consider physical therapy. -Continue gabapentin.  Primary osteoarthritis of right knee Has pain around the knee and the tibia.  Does have inflammatory change of the fat pad as well as the lateral edge of the patellofemoral joint. -Counseled on home exercise therapy and supportive care. -Injection. -X-ray. -Could consider gel injection  or physical therapy. -Rollator. -pennsaid  -Provided work note

## 2020-04-23 NOTE — Assessment & Plan Note (Signed)
Does seem to have a component of radicular symptoms down the leg.  Did get improvement with the gabapentin and prednisone. -Counseled on home exercise therapy and supportive care. -X-ray. -Could consider physical therapy. -Continue gabapentin.

## 2020-05-04 ENCOUNTER — Telehealth: Payer: Self-pay | Admitting: Family Medicine

## 2020-05-04 NOTE — Telephone Encounter (Signed)
Informed of results.   Rosemarie Ax, MD [facility] 05/04/2020, 9:50 AM

## 2020-05-21 ENCOUNTER — Ambulatory Visit: Payer: 59 | Admitting: Family Medicine

## 2020-05-21 NOTE — Progress Notes (Deleted)
Jodi Mccormick - 60 y.o. female MRN [id number]  Date of birth: Aug 18, 1960  SUBJECTIVE:  Including CC & ROS.  No chief complaint on file.   Jodi Mccormick is a 60 y.o. female that is  ***.  ***   Review of Systems See HPI   HISTORY: Past Medical, Surgical, Social, and Family History Reviewed & Updated per EMR.   Pertinent Historical Findings include:  Past Medical History:  Diagnosis Date  . Anemia   . Arthritis    "knees" (08/26/2016)  . Estrogen receptor negative status (ER-)   . Family history of breast cancer 07/20/2016  . Family history of pancreatic cancer 07/20/2016  . GERD (gastroesophageal reflux disease)   . Heart murmur   . Hypertension   . Malignant neoplasm of upper-outer quadrant of right female breast ([place])   . Obesity   . OSA on CPAP   . Personal history of radiation therapy     Past Surgical History:  Procedure Laterality Date  . ABDOMINAL HYSTERECTOMY    . BREAST BIOPSY Right 07/06/2016; 08/18/2016  . BREAST LUMPECTOMY Right 2018  . BREAST LUMPECTOMY WITH RADIOACTIVE SEED AND SENTINEL LYMPH NODE BIOPSY Right 08/18/2016   Procedure: RADIOACTIVE SEED GUIDED RIGHT BREAST LUMPECTOMY, RIGHT AXILLARY SENTINEL LYMPH NODE BIOPSY.;  Surgeon: Excell Seltzer, MD;  Location: [facility];  Service: General;  Laterality: Right;  . BREAST REDUCTION SURGERY Bilateral 08/26/2016   Procedure: right oncoplastic MAMMARY REDUCTION  (BREAST) and left breast reduction for symmetry with bilateral resection of nipple aereola;  Surgeon: [name], MD;  Location: [facility];  Service: Plastics;  Laterality: Bilateral;  . BUNIONECTOMY Left   . DILATION AND CURETTAGE OF UTERUS  1983  . LAPAROSCOPIC CHOLECYSTECTOMY    . REDUCTION MAMMAPLASTY Bilateral 08/26/2016   oncoplastic MAMMARY REDUCTION  (BREAST) and left breast reduction for symmetry with bilateral resection of nipple aereola/notes 08/26/2016  . TUBAL LIGATION  ~2000    Family History  Problem Relation Age of Onset  .  Hypertension Mother   . Leukemia Mother 66  . Hypertension Father   . Diabetes Father   . Pancreatic cancer Father 61       died 6  . Breast cancer Paternal Aunt 64       died in 37's  . Kidney disease Maternal Grandmother   . Breast cancer Paternal Aunt 32       died in 25's  . Breast cancer Paternal Aunt 15       died in 60's  . Breast cancer Paternal Aunt 48    Social History   Socioeconomic History  . Marital status: Married    Spouse name: Not on file  . Number of children: 2  . Years of education: Not on file  . Highest education level: Not on file  Occupational History  . Not on file  Tobacco Use  . Smoking status: Never Smoker  . Smokeless tobacco: Never Used  Vaping Use  . Vaping Use: Never used  Substance and Sexual Activity  . Alcohol use: No  . Drug use: No  . Sexual activity: Yes    Birth control/protection: Surgical  Other Topics Concern  . Not on file  Social History Narrative  . Not on file   Social Determinants of Health   Financial Resource Strain: Not on file  Food Insecurity: Not on file  Transportation Needs: Not on file  Physical Activity: Not on file  Stress: Not on file  Social Connections: Not on file  Intimate  Partner Violence: Not on file     PHYSICAL EXAM:  VS: There were no vitals taken for this visit. Physical Exam Gen: NAD, alert, cooperative with exam, well-appearing MSK:  ***      ASSESSMENT & PLAN:   No problem-specific Assessment & Plan notes found for this encounter.

## 2020-05-23 ENCOUNTER — Other Ambulatory Visit: Payer: Self-pay | Admitting: Oncology

## 2020-07-01 ENCOUNTER — Encounter: Payer: Self-pay | Admitting: Family Medicine

## 2020-07-07 ENCOUNTER — Other Ambulatory Visit: Payer: Self-pay

## 2020-07-07 ENCOUNTER — Ambulatory Visit: Payer: Self-pay

## 2020-07-07 ENCOUNTER — Ambulatory Visit: Payer: 59 | Admitting: Family Medicine

## 2020-07-07 ENCOUNTER — Encounter: Payer: Self-pay | Admitting: Family Medicine

## 2020-07-07 VITALS — BP 158/92 | Ht 66.0 in | Wt 378.0 lb

## 2020-07-07 DIAGNOSIS — M1712 Unilateral primary osteoarthritis, left knee: Secondary | ICD-10-CM

## 2020-07-07 MED ORDER — TRIAMCINOLONE ACETONIDE 40 MG/ML IJ SUSP
40.0000 mg | Freq: Once | INTRAMUSCULAR | Status: AC
  Administered 2020-07-07: 40 mg via INTRA_ARTICULAR

## 2020-07-07 NOTE — Progress Notes (Signed)
Jodi Mccormick - 60 y.o. female MRN [id number]  Date of birth: 03-15-1960  SUBJECTIVE:  Including CC & ROS.  No chief complaint on file.   Jodi Mccormick is a 60 y.o. female that is presenting with acute left knee pain.  She is having trouble the past few days.  Feels similar to her right knee pain.   Review of Systems See HPI   HISTORY: Past Medical, Surgical, Social, and Family History Reviewed & Updated per EMR.   Pertinent Historical Findings include:  Past Medical History:  Diagnosis Date  . Anemia   . Arthritis    "knees" (08/26/2016)  . Estrogen receptor negative status (ER-)   . Family history of breast cancer 07/20/2016  . Family history of pancreatic cancer 07/20/2016  . GERD (gastroesophageal reflux disease)   . Heart murmur   . Hypertension   . Malignant neoplasm of upper-outer quadrant of right female breast ([place])   . Obesity   . OSA on CPAP   . Personal history of radiation therapy     Past Surgical History:  Procedure Laterality Date  . ABDOMINAL HYSTERECTOMY    . BREAST BIOPSY Right 07/06/2016; 08/18/2016  . BREAST LUMPECTOMY Right 2018  . BREAST LUMPECTOMY WITH RADIOACTIVE SEED AND SENTINEL LYMPH NODE BIOPSY Right 08/18/2016   Procedure: RADIOACTIVE SEED GUIDED RIGHT BREAST LUMPECTOMY, RIGHT AXILLARY SENTINEL LYMPH NODE BIOPSY.;  Surgeon: Excell Seltzer, MD;  Location: [facility];  Service: General;  Laterality: Right;  . BREAST REDUCTION SURGERY Bilateral 08/26/2016   Procedure: right oncoplastic MAMMARY REDUCTION  (BREAST) and left breast reduction for symmetry with bilateral resection of nipple aereola;  Surgeon: [name], MD;  Location: [facility];  Service: Plastics;  Laterality: Bilateral;  . BUNIONECTOMY Left   . DILATION AND CURETTAGE OF UTERUS  1983  . LAPAROSCOPIC CHOLECYSTECTOMY    . REDUCTION MAMMAPLASTY Bilateral 08/26/2016   oncoplastic MAMMARY REDUCTION  (BREAST) and left breast reduction for symmetry with bilateral resection of nipple  aereola/notes 08/26/2016  . TUBAL LIGATION  ~2000    Family History  Problem Relation Age of Onset  . Hypertension Mother   . Leukemia Mother 62  . Hypertension Father   . Diabetes Father   . Pancreatic cancer Father 33       died 18  . Breast cancer Paternal Aunt 24       died in 15's  . Kidney disease Maternal Grandmother   . Breast cancer Paternal Aunt 23       died in 47's  . Breast cancer Paternal Aunt 66       died in 44's  . Breast cancer Paternal Aunt 20    Social History   Socioeconomic History  . Marital status: Married    Spouse name: Not on file  . Number of children: 2  . Years of education: Not on file  . Highest education level: Not on file  Occupational History  . Not on file  Tobacco Use  . Smoking status: Never Smoker  . Smokeless tobacco: Never Used  Vaping Use  . Vaping Use: Never used  Substance and Sexual Activity  . Alcohol use: No  . Drug use: No  . Sexual activity: Yes    Birth control/protection: Surgical  Other Topics Concern  . Not on file  Social History Narrative  . Not on file   Social Determinants of Health   Financial Resource Strain: Not on file  Food Insecurity: Not on file  Transportation Needs: Not on file  Physical Activity: Not on file  Stress: Not on file  Social Connections: Not on file  Intimate Partner Violence: Not on file     PHYSICAL EXAM:  VS: BP (!) 158/92 (BP Location: Left Arm, Patient Position: Sitting, Cuff Size: Large)   Ht 5\' 6"  (1.676 m)   Wt (!) 378 lb (171.5 kg)   BMI 61.01 kg/m  Physical Exam Gen: NAD, alert, cooperative with exam, well-appearing MSK:  Left knee: Effusion noted. Normal range of motion. Tenderness palpation over the medial joint space. Neurovascularly intact   Aspiration/Injection Procedure Note Jodi Mccormick [birth year]  Procedure: Injection Indications: Left knee pain  Procedure Details Consent: Risks of procedure as well as the alternatives and risks of each  were explained to the (patient/caregiver).  Consent for procedure obtained. Time Out: Verified patient identification, verified procedure, site/side was marked, verified correct patient position, special equipment/implants available, medications/allergies/relevent history reviewed, required imaging and test results available.  Performed.  The area was cleaned with iodine and alcohol swabs.    The left knee superior lateral suprapatellar pouch was injected using 3 cc of 1% lidocaine on a 21-gauge 2 inch needle.  The syringe was switched and a mixture containing 1 cc's of 40 mg Kenalog and 4 cc's of 0.25% bupivacaine was injected.  Ultrasound was used. Images were obtained in long views showing the injection.     A sterile dressing was applied.  Patient did tolerate procedure well.     ASSESSMENT & PLAN:   Primary osteoarthritis of left knee Has significant degenerative changes appreciated of the medial compartment. -Counseled on home exercise therapy and supportive care. -Injection today. -X-ray. -Could consider physical therapy.

## 2020-07-07 NOTE — Patient Instructions (Signed)
Good to see you Please try ice  Please try the exercises  I will call with the results from today   Please send me a message in MyChart with any questions or updates.  Please see me back in 4 weeks.   --Dr. Raeford Razor

## 2020-07-08 NOTE — Assessment & Plan Note (Signed)
Has significant degenerative changes appreciated of the medial compartment. -Counseled on home exercise therapy and supportive care. -Injection today. -X-ray. -Could consider physical therapy.

## 2020-08-11 ENCOUNTER — Ambulatory Visit: Payer: 59 | Admitting: Family Medicine

## 2020-08-11 NOTE — Progress Notes (Deleted)
Jodi Mccormick - 60 y.o. female MRN [id number]  Date of birth: 02/14/1960  SUBJECTIVE:  Including CC & ROS.  No chief complaint on file.   Jodi Mccormick is a 60 y.o. female that is  ***.  ***   Review of Systems See HPI   HISTORY: Past Medical, Surgical, Social, and Family History Reviewed & Updated per EMR.   Pertinent Historical Findings include:  Past Medical History:  Diagnosis Date   Anemia    Arthritis    "knees" (08/26/2016)   Estrogen receptor negative status (ER-)    Family history of breast cancer 07/20/2016   Family history of pancreatic cancer 07/20/2016   GERD (gastroesophageal reflux disease)    Heart murmur    Hypertension    Malignant neoplasm of upper-outer quadrant of right female breast ([facility])    Obesity    OSA on CPAP    Personal history of radiation therapy     Past Surgical History:  Procedure Laterality Date   ABDOMINAL HYSTERECTOMY     BREAST BIOPSY Right 07/06/2016; 08/18/2016   BREAST LUMPECTOMY Right 2018   BREAST LUMPECTOMY WITH RADIOACTIVE SEED AND SENTINEL LYMPH NODE BIOPSY Right 08/18/2016   Procedure: RADIOACTIVE SEED GUIDED RIGHT BREAST LUMPECTOMY, RIGHT AXILLARY SENTINEL LYMPH NODE BIOPSY.;  Surgeon: Excell Seltzer, MD;  Location: [facility];  Service: General;  Laterality: Right;   BREAST REDUCTION SURGERY Bilateral 08/26/2016   Procedure: right oncoplastic MAMMARY REDUCTION  (BREAST) and left breast reduction for symmetry with bilateral resection of nipple aereola;  Surgeon: [name], MD;  Location: [facility];  Service: Plastics;  Laterality: Bilateral;   BUNIONECTOMY Left    DILATION AND CURETTAGE OF UTERUS  1983   LAPAROSCOPIC CHOLECYSTECTOMY     REDUCTION MAMMAPLASTY Bilateral 08/26/2016   oncoplastic MAMMARY REDUCTION  (BREAST) and left breast reduction for symmetry with bilateral resection of nipple aereola/notes 08/26/2016   TUBAL LIGATION  ~2000    Family History  Problem Relation Age of Onset   Hypertension Mother     Leukemia Mother 96   Hypertension Father    Diabetes Father    Pancreatic cancer Father 81       died 24   Breast cancer Paternal Aunt 43       died in 60's   Kidney disease Maternal Grandmother    Breast cancer Paternal Aunt 85       died in 64's   Breast cancer Paternal Aunt 24       died in 32's   Breast cancer Paternal Aunt 32    Social History   Socioeconomic History   Marital status: Married    Spouse name: Not on file   Number of children: 2   Years of education: Not on file   Highest education level: Not on file  Occupational History   Not on file  Tobacco Use   Smoking status: Never   Smokeless tobacco: Never  Vaping Use   Vaping Use: Never used  Substance and Sexual Activity   Alcohol use: No   Drug use: No   Sexual activity: Yes    Birth control/protection: Surgical  Other Topics Concern   Not on file  Social History Narrative   Not on file   Social Determinants of Health   Financial Resource Strain: Not on file  Food Insecurity: Not on file  Transportation Needs: Not on file  Physical Activity: Not on file  Stress: Not on file  Social Connections: Not on file  Intimate Partner Violence:  Not on file     PHYSICAL EXAM:  VS: There were no vitals taken for this visit. Physical Exam Gen: NAD, alert, cooperative with exam, well-appearing MSK:  ***      ASSESSMENT & PLAN:   No problem-specific Assessment & Plan notes found for this encounter.

## 2020-08-25 ENCOUNTER — Other Ambulatory Visit: Payer: Self-pay | Admitting: Family Medicine

## 2020-09-02 ENCOUNTER — Other Ambulatory Visit: Payer: Self-pay

## 2020-09-02 ENCOUNTER — Ambulatory Visit
Admission: RE | Admit: 2020-09-02 | Discharge: 2020-09-02 | Disposition: A | Discharge status: Home / Self Care | Payer: 59 | Source: Ambulatory Visit | Attending: Oncology | Admitting: Oncology

## 2020-09-02 DIAGNOSIS — R921 Mammographic calcification found on diagnostic imaging of breast: Secondary | ICD-10-CM

## 2020-09-17 ENCOUNTER — Other Ambulatory Visit: Payer: Self-pay

## 2020-09-17 ENCOUNTER — Inpatient Hospital Stay: Payer: 59

## 2020-09-17 ENCOUNTER — Inpatient Hospital Stay: Payer: 59 | Attending: Oncology | Admitting: Oncology

## 2020-09-17 VITALS — BP 149/73 | HR 80 | Temp 98.2°F | Resp 18 | Ht 66.0 in | Wt 380.7 lb

## 2020-09-17 DIAGNOSIS — Z90710 Acquired absence of both cervix and uterus: Secondary | ICD-10-CM | POA: Diagnosis not present

## 2020-09-17 DIAGNOSIS — Z800 Family history of malignant neoplasm of digestive organs: Secondary | ICD-10-CM | POA: Insufficient documentation

## 2020-09-17 DIAGNOSIS — Z90722 Acquired absence of ovaries, bilateral: Secondary | ICD-10-CM | POA: Diagnosis not present

## 2020-09-17 DIAGNOSIS — C50411 Malignant neoplasm of upper-outer quadrant of right female breast: Secondary | ICD-10-CM

## 2020-09-17 DIAGNOSIS — D649 Anemia, unspecified: Secondary | ICD-10-CM | POA: Insufficient documentation

## 2020-09-17 DIAGNOSIS — M129 Arthropathy, unspecified: Secondary | ICD-10-CM | POA: Insufficient documentation

## 2020-09-17 DIAGNOSIS — Z803 Family history of malignant neoplasm of breast: Secondary | ICD-10-CM | POA: Insufficient documentation

## 2020-09-17 DIAGNOSIS — R011 Cardiac murmur, unspecified: Secondary | ICD-10-CM | POA: Diagnosis not present

## 2020-09-17 DIAGNOSIS — Z923 Personal history of irradiation: Secondary | ICD-10-CM | POA: Insufficient documentation

## 2020-09-17 DIAGNOSIS — K219 Gastro-esophageal reflux disease without esophagitis: Secondary | ICD-10-CM | POA: Insufficient documentation

## 2020-09-17 DIAGNOSIS — I10 Essential (primary) hypertension: Secondary | ICD-10-CM | POA: Insufficient documentation

## 2020-09-17 DIAGNOSIS — G4730 Sleep apnea, unspecified: Secondary | ICD-10-CM | POA: Insufficient documentation

## 2020-09-17 DIAGNOSIS — Z171 Estrogen receptor negative status [ER-]: Secondary | ICD-10-CM

## 2020-09-17 DIAGNOSIS — Z79811 Long term (current) use of aromatase inhibitors: Secondary | ICD-10-CM | POA: Diagnosis not present

## 2020-09-17 DIAGNOSIS — E669 Obesity, unspecified: Secondary | ICD-10-CM | POA: Diagnosis not present

## 2020-09-17 DIAGNOSIS — Z79899 Other long term (current) drug therapy: Secondary | ICD-10-CM | POA: Insufficient documentation

## 2020-09-17 DIAGNOSIS — Z170 Estrogen receptor positive status [ER+]: Secondary | ICD-10-CM | POA: Insufficient documentation

## 2020-09-17 NOTE — Progress Notes (Signed)
[place]  [phone]) [phone] Fax:(336) [phone]     ID: Jodi Mccormick DOB: 1960-10-27  MR#: [id number]  OVF#:[id number]  Patient Care Team: Loraine Leriche., MD as PCP - General (Internal Medicine) Excell Seltzer, MD (Inactive) as Consulting Physician (General Surgery) Corrine Tillis, Virgie Dad, MD as Consulting Physician (Oncology) Gery Pray, MD as Consulting Physician (Radiation Oncology) OTHER MD:  CHIEF COMPLAINT: Invasive ductal carcinoma of the right breast  CURRENT TREATMENT:  anastrozole    INTERVAL HISTORY: [name] returns today for follow-up of her estrogen receptor negative breast cancer.  She continues on anastrozole.  She says another patient told her that this medicine can cause arthralgias or myalgias and she is certainly having her share of those.  She had taken Neurontin sometime for what sounds like a right lateral cutaneous femoral nerve problem but after a month that was discontinued.  The problem did resolve.  She was noted to have calcifications at the right breast lumpectomy site on mammogram on 08/28/2019. She underwent short-term follow up right diagnostic mammography with tomography at [facility] on 03/04/2020 showing: breast density category B; stable, probably-benign right breast calcifications likely representing fat necrosis.  She returned for annual bilateral diagnostic mammography with tomography at [facility] on 09/02/2020 showing: breast density category A; no evidence of malignancy in either breast; benign right breast calcifications.    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  She tells me she was started on metformin.  So far she does not have diarrhea problems.  A detailed review of systems today was generally stable.   COVID 19 VACCINATION STATUS: [name] x2   BREAST CANCER HISTORY: From the original intake note:  The patient had screening mammography in [facility]  suggesting a change in her right breast. She was referred to the [facility] where 07/06/2016 she underwent stereotactic biopsy of the upper-outer quadrant of the right breast, which showed ([id number]) an invasive ductal carcinoma, grade 2 or 3, in the setting of ductal carcinoma in situ, grade 3. There was not enough invasive tumor to do a prognostic panel but the noninvasive breast cancer was estrogen and progesterone receptor negative.  Her subsequent history is as detailed below   PAST MEDICAL HISTORY: Past Medical History:  Diagnosis Date   Anemia    Arthritis    "knees" (08/26/2016)   Estrogen receptor negative status (ER-)    Family history of breast cancer 07/20/2016   Family history of pancreatic cancer 07/20/2016   GERD (gastroesophageal reflux disease)    Heart murmur    Hypertension    Malignant neoplasm of upper-outer quadrant of right female breast (HCC)    Obesity    OSA on CPAP    Personal history of radiation therapy     PAST SURGICAL HISTORY: Past Surgical History:  Procedure Laterality Date   ABDOMINAL HYSTERECTOMY     BREAST BIOPSY Right 07/06/2016; 08/18/2016   BREAST LUMPECTOMY Right 2018   BREAST LUMPECTOMY WITH RADIOACTIVE SEED AND SENTINEL LYMPH NODE BIOPSY Right 08/18/2016   Procedure: RADIOACTIVE SEED GUIDED RIGHT BREAST LUMPECTOMY, RIGHT AXILLARY SENTINEL LYMPH NODE BIOPSY.;  Surgeon: Excell Seltzer, MD;  Location: [facility];  Service: General;  Laterality: Right;   BREAST REDUCTION SURGERY Bilateral 08/26/2016   Procedure: right oncoplastic MAMMARY REDUCTION  (BREAST) and left breast reduction for symmetry with bilateral resection of nipple aereola;  Surgeon: [name], MD;  Location: [facility];  Service: Plastics;  Laterality: Bilateral;   BUNIONECTOMY  Left    DILATION AND CURETTAGE OF UTERUS  1983   LAPAROSCOPIC CHOLECYSTECTOMY     REDUCTION MAMMAPLASTY Bilateral 08/26/2016   oncoplastic MAMMARY REDUCTION  (BREAST) and left breast reduction for  symmetry with bilateral resection of nipple aereola/notes 08/26/2016   TUBAL LIGATION  ~2000    FAMILY HISTORY Family History  Problem Relation Age of Onset   Hypertension Mother    Leukemia Mother 63   Hypertension Father    Diabetes Father    Pancreatic cancer Father 70       died 70   Breast cancer Paternal Aunt 36       died in 59's   Kidney disease Maternal Grandmother    Breast cancer Paternal Aunt 41       died in 15's   Breast cancer Paternal Aunt 20       died in 99's   Breast cancer Paternal Aunt 32  The patient's father died at age 7, from pancreatic cancer diagnosed 2 years before. The patient's mother is age 34 as of June 2018. The patient had 4 brothers, no sisters. 4 maternal aunts had breast cancer at various ages.    GYNECOLOGIC HISTORY:  No LMP recorded. Patient has had a hysterectomy. Menarche age 36, first live birth age 50. The patient is GX P2. She stopped having periods in 2010. 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 Her husband [name] is disabled secondary to low back problems. Daughter [name] lives in [place] and is an Therapist, sports. Daughter [name] also lives in [place], [place] where she works in a call center. The patient has 10 grandchildren and one on the way. She attends [employer] in [place]: Not in place   HEALTH MAINTENANCE: Social History   Tobacco Use   Smoking status: Never   Smokeless tobacco: Never  Vaping Use   Vaping Use: Never used  Substance Use Topics   Alcohol use: No   Drug use: No     Colonoscopy: UTD/[facility]  PAP:  Bone density:   No Known Allergies  Current Outpatient Medications  Medication Sig Dispense Refill   amLODipine (NORVASC) 5 MG tablet Take 5 mg by mouth daily.  7    anastrozole (ARIMIDEX) 1 MG tablet TAKE 1 TABLET BY MOUTH EVERY DAY 90 tablet 4   Diclofenac Sodium (PENNSAID) 2 % SOLN Place 1 application onto the skin 2 (two) times daily. 112 g 2   doxazosin (CARDURA) 2 MG tablet Take 2 mg by mouth daily.  11   gabapentin (NEURONTIN) 100 MG capsule Take 1 capsule (100 mg total) by mouth 3 (three) times daily. 60 capsule 1   hydrochlorothiazide (HYDRODIURIL) 25 MG tablet Take 25 mg by mouth daily.     hydrocortisone 2.5 % cream Apply 1 application topically daily as needed (skin redness).     ketoconazole (NIZORAL) 2 % cream Apply 1 application topically daily as needed for irritation.     losartan-hydrochlorothiazide (HYZAAR) 100-25 MG tablet Take 1 tablet by mouth daily.     meloxicam (MOBIC) 15 MG tablet Take 15 mg by mouth daily.     predniSONE (DELTASONE) 5 MG tablet Take 6 pills for first day, 5 pills second day, 4 pills third day, 3 pills fourth day, 2 pills the fifth day, and 1  pill sixth day. 21 tablet 0   No current facility-administered medications for this visit.    OBJECTIVE: African-American woman who appears stated age  2:   09/17/20 1539  BP: (!) 149/73  Pulse: 80  Resp: 18  Temp: 98.2 F (36.8 C)  SpO2: 97%      Body mass index is 61.45 kg/m.   Filed Weights   09/17/20 1539  Weight: (!) 380 lb 11.2 oz (172.7 kg)      ECOG FS:1 - Symptomatic but completely ambulatory   Sclerae unicteric, EOMs intact Wearing a mask No cervical or supraclavicular adenopathy Lungs no rales or rhonchi Heart regular rate and rhythm Abd soft, obese, nontender, positive bowel sounds MSK no focal spinal tenderness, no upper extremity lymphedema Neuro: nonfocal, well oriented, appropriate affect Breasts: Status post bilateral breast reduction, status post right lumpectomy and radiation with no evidence of local recurrence, both axilla are benign.   LAB RESULTS:  CMP     Component Value Date/Time   NA 142 09/16/2016 1016   K 4.6  09/16/2016 1016   CL 103 08/12/2016 1013   CO2 28 09/16/2016 1016   GLUCOSE 98 09/16/2016 1016   BUN 18.8 09/16/2016 1016   CREATININE 0.8 09/16/2016 1016   CALCIUM 10.0 09/16/2016 1016   PROT 7.1 09/16/2016 1016   ALBUMIN 2.8 (L) 09/16/2016 1016   AST 9 09/16/2016 1016   ALT 12 09/16/2016 1016   ALKPHOS 88 09/16/2016 1016   BILITOT 0.25 09/16/2016 1016   GFRNONAA >60 08/12/2016 1013   GFRAA >60 08/12/2016 1013    No results found for: TOTALPROTELP, ALBUMINELP, A1GS, A2GS, BETS, BETA2SER, GAMS, MSPIKE, SPEI  No results found for: KPAFRELGTCHN, LAMBDASER, KAPLAMBRATIO  Lab Results  Component Value Date   WBC 9.1 09/16/2016   NEUTROABS 6.2 09/16/2016   HGB 9.1 (L) 09/16/2016   HCT 30.2 (L) 09/16/2016   MCV 73.5 (L) 09/16/2016   PLT 397 09/16/2016      Chemistry      Component Value Date/Time   NA 142 09/16/2016 1016   K 4.6 09/16/2016 1016   CL 103 08/12/2016 1013   CO2 28 09/16/2016 1016   BUN 18.8 09/16/2016 1016   CREATININE 0.8 09/16/2016 1016      Component Value Date/Time   CALCIUM 10.0 09/16/2016 1016   ALKPHOS 88 09/16/2016 1016   AST 9 09/16/2016 1016   ALT 12 09/16/2016 1016   BILITOT 0.25 09/16/2016 1016       No results found for: LABCA2  No components found for: [id number]  No results for input(s): INR in the last 168 hours.  Urinalysis No results found for: COLORURINE, APPEARANCEUR, LABSPEC, PHURINE, GLUCOSEU, HGBUR, BILIRUBINUR, [place], PROTEINUR, UROBILINOGEN, NITRITE, LEUKOCYTESUR   STUDIES: MM DIAG BREAST TOMO BILATERAL  Result Date: 09/02/2020 CLINICAL DATA:  Follow-up for probably benign calcifications in the RIGHT breast. History of RIGHT breast lumpectomy in 2018. EXAM: DIGITAL DIAGNOSTIC BILATERAL MAMMOGRAM WITH TOMOSYNTHESIS AND CAD TECHNIQUE: Bilateral digital diagnostic mammography and breast tomosynthesis was performed. The images were evaluated with computer-aided detection. COMPARISON:  Previous exams including most recent  RIGHT breast diagnostic mammogram dated 03/04/2020. ACR Breast Density Category a: The breast tissue is almost entirely fatty. FINDINGS: The RIGHT breast calcifications at and adjacent to the lumpectomy site are increasingly coarsened, compatible with benign fat necrosis calcifications. There are no suspicious pleomorphic or fine linear branching calcifications. There are no new dominant masses, suspicious calcifications or secondary signs of malignancy within either breast. IMPRESSION: 1. No  evidence of malignancy within either breast. 2. Benign RIGHT breast calcifications. No additional follow-up diagnostic imaging is necessary for this benign finding. Patient may return to routine annual bilateral screening mammogram schedule. RECOMMENDATION: 1.  Screening mammogram in one year.(Code:[id number]) 2. Per protocol, as the patient is now 2 or more years status post lumpectomy, she may return to annual screening mammography in 1 year. However, given the history of breast cancer, the patient remains eligible for annual diagnostic mammography if preferred. I have discussed the findings and recommendations with the patient. If applicable, a reminder letter will be sent to the patient regarding the next appointment. BI-RADS CATEGORY  2: Benign. Electronically Signed   By: Franki Cabot M.D.   On: 09/02/2020 11:11     ELIGIBLE FOR AVAILABLE RESEARCH PROTOCOL: no  ASSESSMENT: 60 y.o. [place] woman status post right breast upper outer quadrant biopsy 07/06/2016 for an invasive ductal carcinoma, grade 2, with insufficient material for a prognostic panel, in the setting of ductal carcinoma in situ, which was estrogen and progesterone receptor negative  (1) status post right lumpectomy with sentinel lymph node sampling 08/18/2016 showing ductal carcinoma in situ, 0.9 cm, with negative margins,; all 3 sentinel lymph nodes were clear.  (a) status post bilateral reduction mammoplasty 08/26/2016 with negative  pathology  (2) as the invasive component was <0.5 cm Oncotype was not requested  (3) adjuvant radiation completed 11/01/2016 - 12/08/2016 Site/dose:  Right Breast, 50.4 Gy total delivered in 28 fractions  (4) genetics testing 07/26/2016  through the [facility] Hereditary Breast Cancer STAT panel found no deleterious mutations in:  ATM, BRCA1, BRCA2, CDH1, CHEK2, PALB2, PTEN, STK11 and TP53.    (a) the patient declined testing through the common hereditary cancer panel (46 genes)  (5) anastrozole started January 2019, to be continued through December 2024   PLAN: Sherilyn is now just over 4 years out from definitive surgery for her breast cancer with no evidence of disease recurrence.  This is very favorable.  She is tolerating anastrozole well.  She knows that she has significant arthritis issues and this is the reason I think for the aches and pains that she is unfortunately experiencing.  These are being managed appropriately by her primary care physician.  I reviewed her CBCs which show a consistently moderate anemia with severe microcytosis.  She has had several iron determinations in the past which have not been particularly low.  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  I obtain the appropriate labs today and once I get those results I will send her a letter with the results.  We did discuss thalassemias in general and she understands in many cases this is a condition and not a disease but in extreme cases of course you may have patients who are transfusion dependent which is not the case with her.  She will see [place] again in a year and that will be her "graduation visit".  Total encounter time 25 minutes.*  Total encounter time: 20 minutes*   Jacqulene Huntley C. Raynor Calcaterra, MD 09/17/20 4:45 PM Medical Oncology and Hematology [facility] [place], [place] [address] Tel. [phone]    Fax. [phone]   I, Wilburn Mylar, am acting as  scribe for Dr. Virgie Dad. Rashidah [place].  I, Lurline Del MD, have reviewed the above documentation for accuracy and completeness, and I agree with the above.    *Total Encounter Time as defined by the Centers for Medicare and Medicaid Services includes,  in addition to the face-to-face time of a patient visit (documented in the note above) non-face-to-face time: obtaining and reviewing outside history, ordering and reviewing medications, tests or procedures, care coordination (communications with other health care professionals or caregivers) and documentation in the medical record.

## 2020-09-18 LAB — FERRITIN: Ferritin: 58 ng/mL (ref 11–307)

## 2020-09-18 LAB — IRON AND TIBC
Iron: 27 ug/dL — ABNORMAL LOW (ref 41–142)
Saturation Ratios: 10 % — ABNORMAL LOW (ref 21–57)
TIBC: 259 ug/dL (ref 236–444)
UIBC: 232 ug/dL (ref 120–384)

## 2020-09-21 LAB — HGB FRACTIONATION CASCADE
Hgb A2: 2 % (ref 1.8–3.2)
Hgb A: 98 % (ref 96.4–98.8)
Hgb F: 0 % (ref 0.0–2.0)
Hgb S: 0 %

## 2020-11-25 ENCOUNTER — Ambulatory Visit: Payer: 59 | Admitting: Family Medicine

## 2020-11-25 ENCOUNTER — Encounter: Payer: Self-pay | Admitting: Family Medicine

## 2020-11-25 ENCOUNTER — Other Ambulatory Visit: Payer: Self-pay

## 2020-11-25 ENCOUNTER — Ambulatory Visit (HOSPITAL_BASED_OUTPATIENT_CLINIC_OR_DEPARTMENT_OTHER)
Admission: RE | Admit: 2020-11-25 | Discharge: 2020-11-25 | Disposition: A | Discharge status: Home / Self Care | Payer: 59 | Source: Ambulatory Visit | Attending: Family Medicine | Admitting: Family Medicine

## 2020-11-25 VITALS — BP 160/98 | Ht 66.0 in | Wt 380.0 lb

## 2020-11-25 DIAGNOSIS — M48062 Spinal stenosis, lumbar region with neurogenic claudication: Secondary | ICD-10-CM | POA: Diagnosis present

## 2020-11-25 DIAGNOSIS — M4807 Spinal stenosis, lumbosacral region: Secondary | ICD-10-CM | POA: Insufficient documentation

## 2020-11-25 NOTE — Patient Instructions (Signed)
Good to see you Please try the exercises  Please try heat  I will call with the results from today   Please send me a message in MyChart with any questions or updates.  Please see me back in 4-6 weeks.   --Dr. Raeford Razor

## 2020-11-25 NOTE — Assessment & Plan Note (Signed)
Symptoms seem more consistent with spinal stenosis at this is bilateral.  Concerned there may be an intra-articular hip process as it is in the anterior aspect of the thigh. -Counseled on home exercise therapy and supportive care. -X-ray of the hips and pelvis. -Could consider gabapentin versus injection.

## 2020-11-25 NOTE — Progress Notes (Signed)
Rochell Puett - 60 y.o. female MRN [id number]  Date of birth: [birth year]  SUBJECTIVE:  Including CC & ROS.  No chief complaint on file.   Sherlyn Ebbert is a 60 y.o. female that is having pain in each thigh on the anterior aspect.  Occurs worse with standing.  No significant pain with walking or lying down at night.  Has a history of radiculopathy.  No injury inciting event.  Review of the CT abdomen pelvis from 9/8 shows small bilateral renal calculi   Review of Systems See HPI   HISTORY: Past Medical, Surgical, Social, and Family History Reviewed & Updated per EMR.   Pertinent Historical Findings include:  Past Medical History:  Diagnosis Date   Anemia    Arthritis    "knees" (08/26/2016)   Estrogen receptor negative status (ER-)    Family history of breast cancer 07/20/2016   Family history of pancreatic cancer 07/20/2016   GERD (gastroesophageal reflux disease)    Heart murmur    Hypertension    Malignant neoplasm of upper-outer quadrant of right female breast (HCC)    Obesity    OSA on CPAP    Personal history of radiation therapy     Past Surgical History:  Procedure Laterality Date   ABDOMINAL HYSTERECTOMY     BREAST BIOPSY Right 07/06/2016; 08/18/2016   BREAST LUMPECTOMY Right 2018   BREAST LUMPECTOMY WITH RADIOACTIVE SEED AND SENTINEL LYMPH NODE BIOPSY Right 08/18/2016   Procedure: RADIOACTIVE SEED GUIDED RIGHT BREAST LUMPECTOMY, RIGHT AXILLARY SENTINEL LYMPH NODE BIOPSY.;  Surgeon: Excell Seltzer, MD;  Location: [facility];  Service: General;  Laterality: Right;   BREAST REDUCTION SURGERY Bilateral 08/26/2016   Procedure: right oncoplastic MAMMARY REDUCTION  (BREAST) and left breast reduction for symmetry with bilateral resection of nipple aereola;  Surgeon: [name], MD;  Location: [facility];  Service: Plastics;  Laterality: Bilateral;   BUNIONECTOMY Left    DILATION AND CURETTAGE OF UTERUS  1983   LAPAROSCOPIC CHOLECYSTECTOMY     REDUCTION MAMMAPLASTY  Bilateral 08/26/2016   oncoplastic MAMMARY REDUCTION  (BREAST) and left breast reduction for symmetry with bilateral resection of nipple aereola/notes 08/26/2016   TUBAL LIGATION  ~2000    Family History  Problem Relation Age of Onset   Hypertension Mother    Leukemia Mother 24   Hypertension Father    Diabetes Father    Pancreatic cancer Father 2       died 26   Breast cancer Paternal Aunt 21       died in 5's   Kidney disease Maternal Grandmother    Breast cancer Paternal Aunt 48       died in 67's   Breast cancer Paternal Aunt 60       died in 5's   Breast cancer Paternal Aunt 34    Social History   Socioeconomic History   Marital status: Married    Spouse name: Not on file   Number of children: 2   Years of education: Not on file   Highest education level: Not on file  Occupational History   Not on file  Tobacco Use   Smoking status: Never   Smokeless tobacco: Never  Vaping Use   Vaping Use: Never used  Substance and Sexual Activity   Alcohol use: No   Drug use: No   Sexual activity: Yes    Birth control/protection: Surgical  Other Topics Concern   Not on file  Social History Narrative   Not on file  Social Determinants of Health   Financial Resource Strain: Not on file  Food Insecurity: Not on file  Transportation Needs: Not on file  Physical Activity: Not on file  Stress: Not on file  Social Connections: Not on file  Intimate Partner Violence: Not on file     PHYSICAL EXAM:  VS: BP (!) 160/98 (BP Location: Left Arm, Patient Position: Sitting)   Ht 5\' 6"  (1.676 m)   Wt (!) 380 lb (172.4 kg)   BMI 61.33 kg/m  Physical Exam Gen: NAD, alert, cooperative with exam, well-appearing      ASSESSMENT & PLAN:   Spinal stenosis of lumbar region with neurogenic claudication Symptoms seem more consistent with spinal stenosis at this is bilateral.  Concerned there may be an intra-articular hip process as it is in the anterior aspect of the  thigh. -Counseled on home exercise therapy and supportive care. -X-ray of the hips and pelvis. -Could consider gabapentin versus injection.

## 2020-11-26 ENCOUNTER — Telehealth: Payer: Self-pay | Admitting: Family Medicine

## 2020-11-26 NOTE — Telephone Encounter (Signed)
Patient called to check status of X-ray results-advised pt they had ( Not ) been released by the Radiologist & office staff would contact her with update as soon as it has been reviewed.  ---just an FYI .  -glh

## 2020-11-27 MED ORDER — GABAPENTIN 300 MG PO CAPS: 300.0000 mg | ORAL_CAPSULE | Freq: Three times a day (TID) | ORAL | 1 refills | Status: DC

## 2021-01-06 ENCOUNTER — Ambulatory Visit: Payer: 59 | Admitting: Family Medicine

## 2021-01-13 ENCOUNTER — Ambulatory Visit: Payer: 59 | Admitting: Family Medicine

## 2021-03-03 ENCOUNTER — Telehealth: Payer: Self-pay | Admitting: Family Medicine

## 2021-03-03 NOTE — Telephone Encounter (Signed)
Patient called states she is still in significant pain & says Gabapentin is not working/helping  ------ Request provider to order a different pain med or advise if OV needed .  --forwarding message to med asst for review w/ provider.  --glh

## 2021-03-04 ENCOUNTER — Ambulatory Visit (HOSPITAL_BASED_OUTPATIENT_CLINIC_OR_DEPARTMENT_OTHER)
Admission: RE | Admit: 2021-03-04 | Discharge: 2021-03-04 | Disposition: A | Discharge status: Home / Self Care | Payer: 59 | Source: Ambulatory Visit | Attending: Family Medicine | Admitting: Family Medicine

## 2021-03-04 ENCOUNTER — Ambulatory Visit: Payer: 59 | Admitting: Family Medicine

## 2021-03-04 ENCOUNTER — Other Ambulatory Visit: Payer: Self-pay

## 2021-03-04 VITALS — BP 160/78 | Ht 66.0 in | Wt 373.0 lb

## 2021-03-04 DIAGNOSIS — M48062 Spinal stenosis, lumbar region with neurogenic claudication: Secondary | ICD-10-CM

## 2021-03-04 MED ORDER — HYDROCODONE-ACETAMINOPHEN 5-325 MG PO TABS
1.0000 | ORAL_TABLET | Freq: Three times a day (TID) | ORAL | 0 refills | Status: AC | PRN
Start: 2021-03-04 — End: ?

## 2021-03-04 MED ORDER — IBUPROFEN 400 MG PO TABS: 400.0000 mg | ORAL_TABLET | Freq: Three times a day (TID) | ORAL | 1 refills | Status: AC | PRN

## 2021-03-04 NOTE — Assessment & Plan Note (Signed)
Acute on chronic in nature.  Having symptoms bilaterally.  She is more consistent with a neurogenic origin. -Counseled on home exercise therapy and supportive care. -Stop Mobic and start ibuprofen. -Norco. -X-ray. -MRI of the lumbar spine to evaluate for spinal stenosis or nerve impingement with consideration of epidural.

## 2021-03-04 NOTE — Progress Notes (Signed)
°  Jodi Mccormick - 61 y.o. female MRN [id number]  Date of birth: Jul 29, 1960  SUBJECTIVE:  Including CC & ROS.  No chief complaint on file.   Jodi Mccormick is a 61 y.o. female that is presenting with acute worsening of her bilateral leg pain.  She is experiencing pain that originates in the lower lumbar region and extends down both legs.  It is becoming more severe nature.  The pain began in October.  No improvement with medications.  She has been under the supervision of home exercise therapy that was midposition direct and since that time.   Review of Systems See HPI   HISTORY: Past Medical, Surgical, Social, and Family History Reviewed & Updated per EMR.   Pertinent Historical Findings include:  Past Medical History:  Diagnosis Date   Anemia    Arthritis    "knees" (08/26/2016)   Estrogen receptor negative status (ER-)    Family history of breast cancer 07/20/2016   Family history of pancreatic cancer 07/20/2016   GERD (gastroesophageal reflux disease)    Heart murmur    Hypertension    Malignant neoplasm of upper-outer quadrant of right female breast (HCC)    Obesity    OSA on CPAP    Personal history of radiation therapy     Past Surgical History:  Procedure Laterality Date   ABDOMINAL HYSTERECTOMY     BREAST BIOPSY Right 07/06/2016; 08/18/2016   BREAST LUMPECTOMY Right 2018   BREAST LUMPECTOMY WITH RADIOACTIVE SEED AND SENTINEL LYMPH NODE BIOPSY Right 08/18/2016   Procedure: RADIOACTIVE SEED GUIDED RIGHT BREAST LUMPECTOMY, RIGHT AXILLARY SENTINEL LYMPH NODE BIOPSY.;  Surgeon: Excell Seltzer, MD;  Location: [facility];  Service: General;  Laterality: Right;   BREAST REDUCTION SURGERY Bilateral 08/26/2016   Procedure: right oncoplastic MAMMARY REDUCTION  (BREAST) and left breast reduction for symmetry with bilateral resection of nipple aereola;  Surgeon: [name], MD;  Location: [facility];  Service: Plastics;  Laterality: Bilateral;   BUNIONECTOMY Left    DILATION AND  CURETTAGE OF UTERUS  1983   LAPAROSCOPIC CHOLECYSTECTOMY     REDUCTION MAMMAPLASTY Bilateral 08/26/2016   oncoplastic MAMMARY REDUCTION  (BREAST) and left breast reduction for symmetry with bilateral resection of nipple aereola/notes 08/26/2016   TUBAL LIGATION  ~2000     PHYSICAL EXAM:  VS: BP (!) 160/78 (BP Location: Left Arm, Patient Position: Sitting)    Ht 5\' 6"  (1.676 m)    Wt (!) 373 lb (169.2 kg)    BMI 60.20 kg/m  Physical Exam Gen: NAD, alert, cooperative with exam, well-appearing MSK:  Positive straight leg raise. Diminished deep tendon reflexes on the left and right knee. Weakness with the left dorsiflexion to resistance Neurovascularly intact       ASSESSMENT & PLAN:   Spinal stenosis of lumbar region with neurogenic claudication Acute on chronic in nature.  Having symptoms bilaterally.  She is more consistent with a neurogenic origin. -Counseled on home exercise therapy and supportive care. -Stop Mobic and start ibuprofen. -Norco. -X-ray. -MRI of the lumbar spine to evaluate for spinal stenosis or nerve impingement with consideration of epidural.

## 2021-03-04 NOTE — Patient Instructions (Signed)
Good to see you Please use the ibuprofen and stop the mobic  Please use the norco as needed  I will call with the results.  They should give you a call about scheduling the MRI at the [facility]   Please send me a message in MyChart with any questions or updates.  We'll setup a virtual visit once the MRI is resulted.   --Dr. Raeford Razor

## 2021-03-08 ENCOUNTER — Ambulatory Visit (INDEPENDENT_AMBULATORY_CARE_PROVIDER_SITE_OTHER): Payer: 59

## 2021-03-08 ENCOUNTER — Other Ambulatory Visit: Payer: Self-pay

## 2021-03-08 DIAGNOSIS — M48062 Spinal stenosis, lumbar region with neurogenic claudication: Secondary | ICD-10-CM | POA: Diagnosis not present

## 2021-03-08 DIAGNOSIS — M5416 Radiculopathy, lumbar region: Secondary | ICD-10-CM | POA: Diagnosis not present

## 2021-03-09 ENCOUNTER — Telehealth (INDEPENDENT_AMBULATORY_CARE_PROVIDER_SITE_OTHER): Payer: 59 | Admitting: Family Medicine

## 2021-03-09 DIAGNOSIS — M4807 Spinal stenosis, lumbosacral region: Secondary | ICD-10-CM

## 2021-03-09 NOTE — Progress Notes (Signed)
Virtual Visit via Video Note  I connected with Jodi Mccormick on 03/09/21 at  1:10 PM EST by a video enabled telemedicine application and verified that I am speaking with the correct person using two identifiers.  Location: Patient: home Provider: office   I discussed the limitations of evaluation and management by telemedicine and the availability of in person appointments. The patient expressed understanding and agreed to proceed.  History of Present Illness:  Jodi Mccormick is a 61 year old female that is following up after the MRI of her lumbar spine.  This was showing a bilateral foraminal stenosis at L5-S1.   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 -Counseled on home exercise therapy and supportive care -Epidural.  Follow Up Instructions:    I discussed the assessment and treatment plan with the patient. The patient was provided an opportunity to ask questions and all were answered. The patient agreed with the plan and demonstrated an understanding of the instructions.   The patient was advised to call back or seek an in-person evaluation if the symptoms worsen or if the condition fails to improve as anticipated.    Clearance Coots, MD

## 2021-03-09 NOTE — Assessment & Plan Note (Signed)
MRI was demonstrating bilateral foraminal stenosis with no significant spinal stenosis. -Counseled on home exercise therapy and supportive care -Epidural.

## 2021-04-06 ENCOUNTER — Ambulatory Visit
Admission: RE | Admit: 2021-04-06 | Discharge: 2021-04-06 | Disposition: A | Discharge status: Home / Self Care | Payer: 59 | Source: Ambulatory Visit | Attending: Family Medicine | Admitting: Family Medicine

## 2021-04-06 ENCOUNTER — Other Ambulatory Visit: Payer: Self-pay

## 2021-04-06 DIAGNOSIS — M4807 Spinal stenosis, lumbosacral region: Secondary | ICD-10-CM

## 2021-04-06 MED ORDER — METHYLPREDNISOLONE ACETATE 40 MG/ML INJ SUSP (RADIOLOG
80.0000 mg | Freq: Once | INTRAMUSCULAR | Status: AC
  Administered 2021-04-06: 80 mg via EPIDURAL

## 2021-04-06 MED ORDER — IOPAMIDOL (ISOVUE-M 200) INJECTION 41%
1.0000 mL | Freq: Once | INTRAMUSCULAR | Status: AC
  Administered 2021-04-06: 1 mL via EPIDURAL

## 2021-04-06 NOTE — Discharge Instructions (Signed)

## 2021-06-24 ENCOUNTER — Other Ambulatory Visit: Payer: Self-pay | Admitting: Family Medicine

## 2021-07-26 ENCOUNTER — Encounter: Payer: Self-pay | Admitting: Family Medicine

## 2021-08-02 ENCOUNTER — Other Ambulatory Visit: Payer: Self-pay | Admitting: Internal Medicine

## 2021-08-02 DIAGNOSIS — Z1231 Encounter for screening mammogram for malignant neoplasm of breast: Secondary | ICD-10-CM

## 2021-09-03 ENCOUNTER — Ambulatory Visit
Admission: RE | Admit: 2021-09-03 | Discharge: 2021-09-03 | Disposition: A | Discharge status: Home / Self Care | Payer: 59 | Source: Ambulatory Visit | Attending: Internal Medicine | Admitting: Internal Medicine

## 2021-09-03 DIAGNOSIS — Z1231 Encounter for screening mammogram for malignant neoplasm of breast: Secondary | ICD-10-CM

## 2021-09-13 ENCOUNTER — Encounter: Payer: Self-pay | Admitting: Family Medicine

## 2021-09-16 ENCOUNTER — Other Ambulatory Visit: Payer: Self-pay | Admitting: Family Medicine

## 2021-09-16 DIAGNOSIS — M5416 Radiculopathy, lumbar region: Secondary | ICD-10-CM

## 2021-10-01 ENCOUNTER — Ambulatory Visit
Admission: RE | Admit: 2021-10-01 | Discharge: 2021-10-01 | Disposition: A | Discharge status: Home / Self Care | Payer: 59 | Source: Ambulatory Visit | Attending: Family Medicine | Admitting: Family Medicine

## 2021-10-01 DIAGNOSIS — M5416 Radiculopathy, lumbar region: Secondary | ICD-10-CM

## 2021-10-01 MED ORDER — METHYLPREDNISOLONE ACETATE 40 MG/ML INJ SUSP (RADIOLOG
80.0000 mg | Freq: Once | INTRAMUSCULAR | Status: AC
  Administered 2021-10-01: 80 mg via EPIDURAL

## 2021-10-01 MED ORDER — IOPAMIDOL (ISOVUE-M 200) INJECTION 41%
1.0000 mL | Freq: Once | INTRAMUSCULAR | Status: AC
  Administered 2021-10-01: 1 mL via EPIDURAL

## 2021-10-01 NOTE — Discharge Instructions (Signed)

## 2021-11-17 ENCOUNTER — Inpatient Hospital Stay: Payer: 59 | Attending: Hematology and Oncology | Admitting: Hematology and Oncology

## 2021-11-17 ENCOUNTER — Encounter: Payer: Self-pay | Admitting: Hematology and Oncology

## 2021-11-17 ENCOUNTER — Other Ambulatory Visit: Payer: Self-pay

## 2021-11-17 VITALS — BP 166/82 | HR 98 | Temp 97.9°F | Resp 16 | Ht 66.0 in | Wt 377.5 lb

## 2021-11-17 DIAGNOSIS — Z90710 Acquired absence of both cervix and uterus: Secondary | ICD-10-CM | POA: Insufficient documentation

## 2021-11-17 DIAGNOSIS — Z923 Personal history of irradiation: Secondary | ICD-10-CM | POA: Diagnosis not present

## 2021-11-17 DIAGNOSIS — Z800 Family history of malignant neoplasm of digestive organs: Secondary | ICD-10-CM | POA: Insufficient documentation

## 2021-11-17 DIAGNOSIS — Z86000 Personal history of in-situ neoplasm of breast: Secondary | ICD-10-CM | POA: Insufficient documentation

## 2021-11-17 DIAGNOSIS — I10 Essential (primary) hypertension: Secondary | ICD-10-CM | POA: Diagnosis not present

## 2021-11-17 DIAGNOSIS — C50411 Malignant neoplasm of upper-outer quadrant of right female breast: Secondary | ICD-10-CM | POA: Diagnosis not present

## 2021-11-17 DIAGNOSIS — Z171 Estrogen receptor negative status [ER-]: Secondary | ICD-10-CM

## 2021-11-17 DIAGNOSIS — Z803 Family history of malignant neoplasm of breast: Secondary | ICD-10-CM | POA: Insufficient documentation

## 2021-11-17 DIAGNOSIS — Z806 Family history of leukemia: Secondary | ICD-10-CM | POA: Diagnosis not present

## 2021-11-17 DIAGNOSIS — Z79811 Long term (current) use of aromatase inhibitors: Secondary | ICD-10-CM | POA: Diagnosis not present

## 2021-11-17 NOTE — Progress Notes (Signed)
[facility]  [phone] [phone]) [phone]     ID: Jodi Mccormick DOB: October 24, 1960  MR#: [id number]  PHX#:[id number]  Patient Care Team: Loraine Leriche., MD as PCP - General (Internal Medicine) Excell Seltzer, MD (Inactive) as Consulting Physician (General Surgery) Magrinat, Virgie Dad, MD (Inactive) as Consulting Physician (Oncology) Gery Pray, MD as Consulting Physician (Radiation Oncology) OTHER MD:  CHIEF COMPLAINT: Invasive ductal carcinoma of the right breast  CURRENT TREATMENT:  anastrozole   INTERVAL HISTORY:  Jodi Mccormick returns today for follow-up of her estrogen receptor negative breast cancer. She continues on anastrozole.  She denies any new complaints.  She continues to have arthralgias, thinks this could be arthritis as well.  She is excited about almost being done with the medication end of this year.  She denies any new breast changes.  Last screening mammogram in August 2023 with no concerns.  Rest of the pertinent 10 point ROS reviewed and negative.   COVID 19 VACCINATION STATUS: Jodi Mccormick x2   BREAST CANCER HISTORY: From the original intake note:  The patient had screening mammography in [facility] suggesting a change in her right breast. She was referred to the [facility] where 07/06/2016 she underwent stereotactic biopsy of the upper-outer quadrant of the right breast, which showed ([id number]) an invasive ductal carcinoma, grade 2 or 3, in the setting of ductal carcinoma in situ, grade 3. There was not enough invasive tumor to do a prognostic panel but the noninvasive breast cancer was estrogen and progesterone receptor negative.  Her subsequent history is as detailed below   PAST MEDICAL HISTORY: Past Medical History:  Diagnosis Date   Anemia    Arthritis    "knees" (08/26/2016)   Estrogen receptor negative status (ER-)    Family history of breast cancer 07/20/2016   Family history of pancreatic cancer 07/20/2016    GERD (gastroesophageal reflux disease)    Heart murmur    Hypertension    Malignant neoplasm of upper-outer quadrant of right female breast (HCC)    Obesity    OSA on CPAP    Personal history of radiation therapy     PAST SURGICAL HISTORY: Past Surgical History:  Procedure Laterality Date   ABDOMINAL HYSTERECTOMY     BREAST BIOPSY Right 07/06/2016; 08/18/2016   BREAST LUMPECTOMY Right 2018   BREAST LUMPECTOMY WITH RADIOACTIVE SEED AND SENTINEL LYMPH NODE BIOPSY Right 08/18/2016   Procedure: RADIOACTIVE SEED GUIDED RIGHT BREAST LUMPECTOMY, RIGHT AXILLARY SENTINEL LYMPH NODE BIOPSY.;  Surgeon: Excell Seltzer, MD;  Location: [facility];  Service: General;  Laterality: Right;   BREAST REDUCTION SURGERY Bilateral 08/26/2016   Procedure: right oncoplastic MAMMARY REDUCTION  (BREAST) and left breast reduction for symmetry with bilateral resection of nipple aereola;  Surgeon: [name], MD;  Location: [facility];  Service: Plastics;  Laterality: Bilateral;   BUNIONECTOMY Left    DILATION AND CURETTAGE OF UTERUS  1983   LAPAROSCOPIC CHOLECYSTECTOMY     REDUCTION MAMMAPLASTY Bilateral 08/26/2016   oncoplastic MAMMARY REDUCTION  (BREAST) and left breast reduction for symmetry with bilateral resection of nipple aereola/notes 08/26/2016   TUBAL LIGATION  ~2000    FAMILY HISTORY Family History  Problem Relation Age of Onset   Hypertension Mother    Leukemia Mother 94   Hypertension Father    Diabetes Father    Pancreatic cancer Father 1       died 24   Breast cancer Paternal Aunt 18       died in 21's  Kidney disease Maternal Grandmother    Breast cancer Paternal Aunt 11       died in 48's   Breast cancer Paternal Aunt 93       died in 72's   Breast cancer Paternal Aunt 55  The patient's father died at age 70, from pancreatic cancer diagnosed 2 years before. The patient's mother is age 68 as of June 2018. The patient had 4 brothers, no sisters. 4 maternal aunts had breast cancer at  various ages.    GYNECOLOGIC HISTORY:  No LMP recorded. Patient has had a hysterectomy. Menarche age 89, first live birth age 51. The patient is GX P2. She stopped having periods in 2010. 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 Her husband [name] is disabled secondary to low back problems. Daughter [name] lives in [place] and is an Therapist, sports. Daughter [name] also lives in [place], [place] where she works in a call center. The patient has 10 grandchildren and one on the way. She attends [employer] in [place]: Not in place   HEALTH MAINTENANCE: Social History   Tobacco Use   Smoking status: Never   Smokeless tobacco: Never  Vaping Use   Vaping Use: Never used  Substance Use Topics   Alcohol use: No   Drug use: No     Colonoscopy: UTD/Peters  PAP:  Bone density:   No Known Allergies  Current Outpatient Medications  Medication Sig Dispense Refill   amLODipine (NORVASC) 5 MG tablet Take 5 mg by mouth daily.  7   anastrozole (ARIMIDEX) 1 MG tablet TAKE 1 TABLET BY MOUTH EVERY DAY 90 tablet 4   Diclofenac Sodium (PENNSAID) 2 % SOLN Place 1 application onto the skin 2 (two) times daily. 112 g 2   doxazosin (CARDURA) 2 MG tablet Take 2 mg by mouth daily.  11   gabapentin (NEURONTIN) 300 MG capsule TAKE 1 CAPSULE BY MOUTH THREE TIMES A DAY 90 capsule 1   hydrochlorothiazide (HYDRODIURIL) 25 MG tablet Take 25 mg by mouth daily.     HYDROcodone-acetaminophen (NORCO/VICODIN) 5-325 MG tablet Take 1 tablet by mouth every 8 (eight) hours as needed. 15 tablet 0   hydrocortisone 2.5 % cream Apply 1 application topically daily as needed (skin redness).     ibuprofen (ADVIL) 400 MG tablet Take 1 tablet (400 mg total) by mouth every 8 (eight) hours as needed.  45 tablet 1   ketoconazole (NIZORAL) 2 % cream Apply 1 application topically daily as needed for irritation.     losartan-hydrochlorothiazide (HYZAAR) 100-25 MG tablet Take 1 tablet by mouth daily.     metFORMIN (GLUCOPHAGE) 500 MG tablet Take 1 tablet (500 mg total) by mouth 2 (two) times daily with a meal.     No current facility-administered medications for this visit.    OBJECTIVE: African-American woman who appears stated age  61:   11/17/21 1450  BP: (!) 166/82  Pulse: 98  Resp: 16  Temp: 97.9 F (36.6 C)  SpO2: 96%      Body mass index is 60.93 kg/m.   Filed Weights   11/17/21 1450  Weight: (!) 377 lb 8 oz (171.2 kg)      ECOG FS:1 - Symptomatic but completely ambulatory   Physical Exam Constitutional:      Appearance:  Normal appearance.  Cardiovascular:     Rate and Rhythm: Normal rate and regular rhythm.  Chest:     Comments: Bilateral breasts inspected.  She is status post bilateral reduction mammoplasty.  No new masses or regional adenopathy. Musculoskeletal:        General: No swelling.     Cervical back: Normal range of motion and neck supple. No rigidity.  Lymphadenopathy:     Cervical: No cervical adenopathy.  Neurological:     Mental Status: She is alert.       LAB RESULTS:  CMP     Component Value Date/Time   NA 142 09/16/2016 1016   K 4.6 09/16/2016 1016   CL 103 08/12/2016 1013   CO2 28 09/16/2016 1016   GLUCOSE 98 09/16/2016 1016   BUN 18.8 09/16/2016 1016   CREATININE 0.8 09/16/2016 1016   CALCIUM 10.0 09/16/2016 1016   PROT 7.1 09/16/2016 1016   ALBUMIN 2.8 (L) 09/16/2016 1016   AST 9 09/16/2016 1016   ALT 12 09/16/2016 1016   ALKPHOS 88 09/16/2016 1016   BILITOT 0.25 09/16/2016 1016   GFRNONAA >60 08/12/2016 1013   GFRAA >60 08/12/2016 1013    No results found for: "TOTALPROTELP", "ALBUMINELP", "A1GS", "A2GS", "BETS", "BETA2SER", "GAMS", "MSPIKE", "SPEI"  No results found for: "KPAFRELGTCHN", "LAMBDASER",  "KAPLAMBRATIO"  Lab Results  Component Value Date   WBC 9.1 09/16/2016   NEUTROABS 6.2 09/16/2016   HGB 9.1 (L) 09/16/2016   HCT 30.2 (L) 09/16/2016   MCV 73.5 (L) 09/16/2016   PLT 397 09/16/2016      Chemistry      Component Value Date/Time   NA 142 09/16/2016 1016   K 4.6 09/16/2016 1016   CL 103 08/12/2016 1013   CO2 28 09/16/2016 1016   BUN 18.8 09/16/2016 1016   CREATININE 0.8 09/16/2016 1016      Component Value Date/Time   CALCIUM 10.0 09/16/2016 1016   ALKPHOS 88 09/16/2016 1016   AST 9 09/16/2016 1016   ALT 12 09/16/2016 1016   BILITOT 0.25 09/16/2016 1016       No results found for: "LABCA2"  No components found for: "XIDHWY616"  No results for input(s): "INR" in the last 168 hours.  Urinalysis No results found for: "COLORURINE", "APPEARANCEUR", "LABSPEC", "PHURINE", "GLUCOSEU", "HGBUR", "BILIRUBINUR", "KETONESUR", "PROTEINUR", "UROBILINOGEN", "NITRITE", "LEUKOCYTESUR"   STUDIES: No results found.   ELIGIBLE FOR AVAILABLE RESEARCH PROTOCOL: no  ASSESSMENT: 61 y.o. [place] woman status post right breast upper outer quadrant biopsy 07/06/2016 for an invasive ductal carcinoma, grade 2, with insufficient material for a prognostic panel, in the setting of ductal carcinoma in situ, which was estrogen and progesterone receptor negative  (1) status post right lumpectomy with sentinel lymph node sampling 08/18/2016 showing ductal carcinoma in situ, 0.9 cm, with negative margins,; all 3 sentinel lymph nodes were clear.  (a) status post bilateral reduction mammoplasty 08/26/2016 with negative pathology  (2) as the invasive component was <0.5 cm Oncotype was not requested  (3) adjuvant radiation completed 11/01/2016 - 12/08/2016 Site/dose:  Right Breast, 50.4 Gy total delivered in 28 fractions  (4) genetics testing 07/26/2016  through the [facility] Hereditary Breast Cancer STAT panel found no deleterious mutations in:  ATM, BRCA1, BRCA2, CDH1, CHEK2, PALB2,  PTEN, STK11 and TP53.    (a) the patient declined testing through the common hereditary cancer panel (46 genes)  (5) anastrozole started January 2019, to be continued through December 2024   PLAN: Ms. Jodi Mccormick will be done with  5 years of antiestrogen therapy end of this year.  She is very excited about this.  Since she has now been cancer free for 5 years, we have discussed about returning to PCP with regular screening mammograms versus continuing follow-up in breast oncology annually.  She feels confident with following up with her primary care doctor.  She was instructed to call us with any new questions or concerns.  We will be happy to see her back if she needs [place]. She was instructed to continue self breast exam and report any changes to her doctor. Return to clinic in as needed.  She will get her mammograms ordered by her PCP.  Total time spent: 30 min  *Total Encounter Time as defined by the Centers for Medicare and Medicaid Services includes, in addition to the face-to-face time of a patient visit (documented in the note above) non-face-to-face time: obtaining and reviewing outside history, ordering and reviewing medications, tests or procedures, care coordination (communications with other health care professionals or caregivers) and documentation in the medical record.

## 2022-02-22 ENCOUNTER — Encounter: Payer: Self-pay | Admitting: Family Medicine

## 2022-02-22 ENCOUNTER — Other Ambulatory Visit: Payer: Self-pay | Admitting: *Deleted

## 2022-02-22 MED ORDER — GABAPENTIN 300 MG PO CAPS: ORAL_CAPSULE | ORAL | 2 refills | Status: AC

## 2022-02-23 ENCOUNTER — Other Ambulatory Visit: Payer: Self-pay

## 2022-02-23 ENCOUNTER — Emergency Department (HOSPITAL_BASED_OUTPATIENT_CLINIC_OR_DEPARTMENT_OTHER)
Admission: EM | Admit: 2022-02-23 | Discharge: 2022-02-23 | Disposition: A | Discharge status: Home / Self Care | Payer: 59 | Attending: Emergency Medicine | Admitting: Emergency Medicine

## 2022-02-23 ENCOUNTER — Emergency Department (HOSPITAL_BASED_OUTPATIENT_CLINIC_OR_DEPARTMENT_OTHER): Payer: 59

## 2022-02-23 ENCOUNTER — Encounter (HOSPITAL_BASED_OUTPATIENT_CLINIC_OR_DEPARTMENT_OTHER): Payer: Self-pay

## 2022-02-23 DIAGNOSIS — Z79899 Other long term (current) drug therapy: Secondary | ICD-10-CM | POA: Diagnosis not present

## 2022-02-23 DIAGNOSIS — R072 Precordial pain: Secondary | ICD-10-CM | POA: Diagnosis not present

## 2022-02-23 DIAGNOSIS — R079 Chest pain, unspecified: Secondary | ICD-10-CM

## 2022-02-23 DIAGNOSIS — I10 Essential (primary) hypertension: Secondary | ICD-10-CM | POA: Insufficient documentation

## 2022-02-23 DIAGNOSIS — R0789 Other chest pain: Secondary | ICD-10-CM | POA: Diagnosis present

## 2022-02-23 LAB — COMPREHENSIVE METABOLIC PANEL
ALT: 14 U/L (ref 0–44)
AST: 15 U/L (ref 15–41)
Albumin: 3.7 g/dL (ref 3.5–5.0)
Alkaline Phosphatase: 71 U/L (ref 38–126)
Anion gap: 8 (ref 5–15)
BUN: 25 mg/dL — ABNORMAL HIGH (ref 6–20)
CO2: 29 mmol/L (ref 22–32)
Calcium: 9 mg/dL (ref 8.9–10.3)
Chloride: 101 mmol/L (ref 98–111)
Creatinine, Ser: 0.75 mg/dL (ref 0.44–1.00)
GFR, Estimated: >60 mL/min (ref >60)
Glucose, Bld: 98 mg/dL (ref 70–99)
Potassium: 3.6 mmol/L (ref 3.5–5.1)
Sodium: 138 mmol/L (ref 135–145)
Total Bilirubin: 0.2 mg/dL — ABNORMAL LOW (ref 0.3–1.2)
Total Protein: 7.9 g/dL (ref 6.5–8.1)

## 2022-02-23 LAB — CBC WITH DIFFERENTIAL/PLATELET
Abs Immature Granulocytes: 0.03 10*3/uL (ref 0.00–0.07)
Basophils Absolute: 0 10*3/uL (ref 0.0–0.1)
Basophils Relative: 0 %
Eosinophils Absolute: 0.2 10*3/uL (ref 0.0–0.5)
Eosinophils Relative: 2 %
HCT: 35.1 % — ABNORMAL LOW (ref 36.0–46.0)
Hemoglobin: 10.5 g/dL — ABNORMAL LOW (ref 12.0–15.0)
Immature Granulocytes: 0 %
Lymphocytes Relative: 22 %
Lymphs Abs: 1.8 10*3/uL (ref 0.7–4.0)
MCH: 21.6 pg — ABNORMAL LOW (ref 26.0–34.0)
MCHC: 29.9 g/dL — ABNORMAL LOW (ref 30.0–36.0)
MCV: 72.2 fL — ABNORMAL LOW (ref 80.0–100.0)
Monocytes Absolute: 0.5 10*3/uL (ref 0.1–1.0)
Monocytes Relative: 6 %
Neutro Abs: 5.5 10*3/uL (ref 1.7–7.7)
Neutrophils Relative %: 70 %
Platelets: 353 10*3/uL (ref 150–400)
RBC: 4.86 MIL/uL (ref 3.87–5.11)
RDW: 17.7 % — ABNORMAL HIGH (ref 11.5–15.5)
WBC: 8 10*3/uL (ref 4.0–10.5)
nRBC: 0 % (ref 0.0–0.2)

## 2022-02-23 LAB — TROPONIN I (HIGH SENSITIVITY)
Troponin I (High Sensitivity): 7 ng/L (ref <18)
Troponin I (High Sensitivity): 7 ng/L (ref <18)

## 2022-02-23 MED ORDER — MAALOX MAX 400-400-40 MG/5ML PO SUSP: 5.0000 mL | Freq: Four times a day (QID) | ORAL | 0 refills | Status: AC | PRN

## 2022-02-23 MED ORDER — ALUM & MAG HYDROXIDE-SIMETH 200-200-20 MG/5ML PO SUSP
30.0000 mL | Freq: Once | ORAL | Status: AC
  Administered 2022-02-23: 30 mL via ORAL
  Filled 2022-02-23: qty 30

## 2022-02-23 MED ORDER — PANTOPRAZOLE SODIUM 20 MG PO TBEC: 20.0000 mg | DELAYED_RELEASE_TABLET | Freq: Every day | ORAL | 1 refills | Status: AC

## 2022-02-23 MED ORDER — IOHEXOL 350 MG/ML SOLN
100.0000 mL | Freq: Once | INTRAVENOUS | Status: AC | PRN
  Administered 2022-02-23: 125 mL via INTRAVENOUS

## 2022-02-23 NOTE — Discharge Instructions (Addendum)
You have been seen and discharged from the emergency department.  Your cardiac workup was normal.  The CT of your chest showed no abnormality, no food bolus.  Your most likely suffering from esophageal irritation.  Take medication as prescribed, eat small bites, avoid spicy food.  Follow-up with your primary provider for further evaluation and further care. Take home medications as prescribed. If you have any worsening symptoms or further concerns for your health please return to an emergency department for further evaluation.

## 2022-02-23 NOTE — ED Provider Notes (Signed)
[facility] Provider Note   CSN: [id number] Arrival date & time: 02/23/22  0750     History  Chief Complaint  Patient presents with   Chest Pain    Jodi Mccormick is a 62 y.o. female.  HPI   62 year old female with past medical history of obesity, HTN, anemia, GERD presents to the emergency department with chest discomfort.  Patient states yesterday morning around 9 AM she was eating a sausage sandwich.  She briefly felt like her first bite got stuck and since then she has had midsternal chest discomfort.  She states the pain is worse with any swallowing.  She was able to eat lunch and drink fluids with discomfort but no other issues.  She presents today because the discomfort is ongoing, now painful at rest and radiating to her back.  She feels mild nausea and indigestion but denies any vomiting or diarrhea.  This is never happened before.  Home Medications Prior to Admission medications   Medication Sig Start Date End Date Taking? Authorizing Provider  amLODipine (NORVASC) 5 MG tablet Take 5 mg by mouth daily. 07/08/16   [provider]  anastrozole (ARIMIDEX) 1 MG tablet TAKE 1 TABLET BY MOUTH EVERY DAY 05/24/20   Magrinat, Virgie Dad, MD  Diclofenac Sodium (PENNSAID) 2 % SOLN Place 1 application onto the skin 2 (two) times daily. 04/23/20   Rosemarie Ax, MD  doxazosin (CARDURA) 2 MG tablet Take 2 mg by mouth daily. 11/15/17   [provider]  gabapentin (NEURONTIN) 300 MG capsule TAKE 1 CAPSULE BY MOUTH THREE TIMES A DAY Please Schedule visit for further refills. 02/22/22   Rosemarie Ax, MD  hydrochlorothiazide (HYDRODIURIL) 25 MG tablet Take 25 mg by mouth daily. 08/26/19   [provider]  HYDROcodone-acetaminophen (NORCO/VICODIN) 5-325 MG tablet Take 1 tablet by mouth every 8 (eight) hours as needed. 03/04/21   Rosemarie Ax, MD  hydrocortisone 2.5 % cream Apply 1 application topically daily as  needed (skin redness).    [provider]  ibuprofen (ADVIL) 400 MG tablet Take 1 tablet (400 mg total) by mouth every 8 (eight) hours as needed. 03/04/21   Rosemarie Ax, MD  ketoconazole (NIZORAL) 2 % cream Apply 1 application topically daily as needed for irritation.    [provider]  losartan-hydrochlorothiazide (HYZAAR) 100-25 MG tablet Take 1 tablet by mouth daily.    [provider]  metFORMIN (GLUCOPHAGE) 500 MG tablet Take 1 tablet (500 mg total) by mouth 2 (two) times daily with a meal. 09/17/20   Magrinat, Virgie Dad, MD      Allergies    Patient has no known allergies.    Review of Systems   Review of Systems  Constitutional:  Negative for fever.  HENT:  Negative for trouble swallowing and voice change.   Respiratory:  Negative for shortness of breath.   Cardiovascular:  Positive for chest pain.  Gastrointestinal:  Negative for abdominal pain, diarrhea and vomiting.  Musculoskeletal:  Positive for back pain.  Skin:  Negative for rash.  Neurological:  Negative for headaches.    Physical Exam Updated Vital Signs BP 128/60   Pulse 69   Temp 98 F (36.7 C) (Oral)   Resp 18   Ht '5\' 6"'$  (1.676 m)   Wt (!) 163.3 kg   SpO2 99%   BMI 58.11 kg/m  Physical Exam Vitals and nursing note reviewed.  Constitutional:      General:  She is not in acute distress.    Appearance: Normal appearance. She is not ill-appearing.     Comments: Swallowing saliva freely  HENT:     Head: Normocephalic.     Mouth/Throat:     Mouth: Mucous membranes are moist.  Cardiovascular:     Rate and Rhythm: Normal rate.  Pulmonary:     Effort: Pulmonary effort is normal. No respiratory distress.  Chest:     Chest wall: No tenderness or crepitus.  Abdominal:     Palpations: Abdomen is soft.     Tenderness: There is no abdominal tenderness. There is no guarding.  Skin:    General: Skin is warm.  Neurological:     Mental Status: She is alert and oriented to person,  place, and time. Mental status is at baseline.  Psychiatric:        Mood and Affect: Mood normal.     ED Results / Procedures / Treatments   Labs (all labs ordered are listed, but only abnormal results are displayed) Labs Reviewed  CBC WITH DIFFERENTIAL/PLATELET - Abnormal; Notable for the following components:      Result Value   Hemoglobin 10.5 (*)    HCT 35.1 (*)    MCV 72.2 (*)    MCH 21.6 (*)    MCHC 29.9 (*)    RDW 17.7 (*)    All other components within normal limits  COMPREHENSIVE METABOLIC PANEL  TROPONIN I (HIGH SENSITIVITY)    EKG EKG Interpretation  Date/Time:  Wednesday February 23 2022 07:59:20 EST Ventricular Rate:  83 PR Interval:  172 QRS Duration: 102 QT Interval:  399 QTC Calculation: 469 R Axis:   38 Text Interpretation: Sinus rhythm Nonspecific T abnormalities, lateral leads Confirmed by Lavenia Atlas [phone]) on 02/23/2022 8:24:05 AM  Radiology DG Chest 2 View  Result Date: 02/23/2022 CLINICAL DATA:  Chest pain. Possible food bolus. Patient feels like she has a piece of croissant stuck in lower esophagus. Pain in chest especially when swallows. Started yesterday after sheath. EXAM: CHEST - 2 VIEW COMPARISON:  None Available. FINDINGS: Cardiac silhouette and mediastinal contours are within normal limits. Mild elevation of the right hemidiaphragm. Mildly decreased lung volumes. No focal airspace opacity. No pleural effusion or pneumothorax. Mild multilevel degenerative disc changes of the thoracic spine. The trachea and bilateral mainstem bronchi are well aerated. No esophageal dilatation is appreciated. IMPRESSION: 1. No acute cardiopulmonary process. 2. The central airways are clear. Mild elevation of the right hemidiaphragm. Electronically Signed   By: Yvonne Kendall M.D.   On: 02/23/2022 08:31    Procedures Procedures    Medications Ordered in ED Medications  alum & mag hydroxide-simeth (MAALOX/MYLANTA) 200-200-20 MG/5ML suspension 30 mL (has no  administration in time range)    ED Course/ Medical Decision Making/ A&P                             Medical Decision Making Amount and/or Complexity of Data Reviewed Labs: ordered. Radiology: ordered.  Risk OTC drugs. Prescription drug management.   62 year old female presents emergency department with midsternal discomfort after eating a sausage sandwich.  She states it feels like food may have gotten stuck but she has been able to eat and drink with only mild discomfort following this.  She has not complaining that the pain is radiating straight through to her back.  EKG is unchanged for the patient.  Cardiac workup is negative, chest x-ray is  nonrevealing.  After medication for symptom control she is still uncomfortable.  CTA of the chest identifies no food bolus but more specifically no cardiac/vascular abnormality.  Patient feels improved, she is been able to p.o.  Plan for outpatient follow-up.  Patient at this time appears safe and stable for discharge and close outpatient follow up. Discharge plan and strict return to ED precautions discussed, patient verbalizes understanding and agreement.        Final Clinical Impression(s) / ED Diagnoses Final diagnoses:  None    Rx / DC Orders ED Discharge Orders     None         Lorelle Gibbs, DO 02/23/22 1540

## 2022-02-23 NOTE — ED Notes (Signed)
Pt states felt  like she had food stuck in her throat since 9am , she called her dr and was told to eat bread and drink water without relief and she took  a tums

## 2022-02-23 NOTE — ED Triage Notes (Signed)
States she was eating at 0900 yesterday morning when she started getting pain in the center of her chest. Thought it was indigestion or food stuck in her chest but hasn't improved. Took medicine without relief.

## 2022-05-16 ENCOUNTER — Encounter: Payer: Self-pay | Admitting: *Deleted

## 2022-08-02 ENCOUNTER — Other Ambulatory Visit: Payer: Self-pay | Admitting: Internal Medicine

## 2022-08-02 DIAGNOSIS — Z1231 Encounter for screening mammogram for malignant neoplasm of breast: Secondary | ICD-10-CM

## 2022-09-06 ENCOUNTER — Ambulatory Visit: Admission: RE | Admit: 2022-09-06 | Payer: 59 | Source: Ambulatory Visit

## 2022-09-06 DIAGNOSIS — Z1231 Encounter for screening mammogram for malignant neoplasm of breast: Secondary | ICD-10-CM

## 2023-01-13 ENCOUNTER — Emergency Department (HOSPITAL_BASED_OUTPATIENT_CLINIC_OR_DEPARTMENT_OTHER)
Admission: EM | Admit: 2023-01-13 | Discharge: 2023-01-13 | Disposition: A | Discharge status: Home / Self Care | Payer: BC Managed Care – PPO | Attending: Emergency Medicine | Admitting: Emergency Medicine

## 2023-01-13 ENCOUNTER — Encounter (HOSPITAL_BASED_OUTPATIENT_CLINIC_OR_DEPARTMENT_OTHER): Payer: Self-pay

## 2023-01-13 ENCOUNTER — Other Ambulatory Visit: Payer: Self-pay

## 2023-01-13 ENCOUNTER — Emergency Department (HOSPITAL_BASED_OUTPATIENT_CLINIC_OR_DEPARTMENT_OTHER): Payer: BC Managed Care – PPO

## 2023-01-13 DIAGNOSIS — Z853 Personal history of malignant neoplasm of breast: Secondary | ICD-10-CM | POA: Insufficient documentation

## 2023-01-13 DIAGNOSIS — Z79899 Other long term (current) drug therapy: Secondary | ICD-10-CM | POA: Insufficient documentation

## 2023-01-13 DIAGNOSIS — R519 Headache, unspecified: Secondary | ICD-10-CM | POA: Insufficient documentation

## 2023-01-13 DIAGNOSIS — I10 Essential (primary) hypertension: Secondary | ICD-10-CM | POA: Insufficient documentation

## 2023-01-13 DIAGNOSIS — E119 Type 2 diabetes mellitus without complications: Secondary | ICD-10-CM | POA: Diagnosis not present

## 2023-01-13 DIAGNOSIS — R110 Nausea: Secondary | ICD-10-CM | POA: Diagnosis not present

## 2023-01-13 DIAGNOSIS — Z7984 Long term (current) use of oral hypoglycemic drugs: Secondary | ICD-10-CM | POA: Diagnosis not present

## 2023-01-13 LAB — CBG MONITORING, ED: Glucose-Capillary: 98 mg/dL (ref 70–99)

## 2023-01-13 MED ORDER — METOCLOPRAMIDE HCL 5 MG/ML IJ SOLN
10.0000 mg | Freq: Once | INTRAMUSCULAR | Status: AC
  Administered 2023-01-13: 10 mg via INTRAVENOUS
  Filled 2023-01-13: qty 2

## 2023-01-13 NOTE — ED Provider Notes (Signed)
[place] [facility] Provider Note   CSN: [id number] Arrival date & time: 01/13/23  0129     History  Chief Complaint  Patient presents with   Headache    Jodi Mccormick is a 62 y.o. female.  The history is provided by the patient.  Headache Jodi Mccormick is a 62 y.o. female who presents to the Emergency Department complaining of headache.  She presents to the emergency department for evaluation of headache that started around 3 PM.  It initially was over her right frontal region/ocular area and gradually worsened across her forehead.  Pain is worse with movement and described as throbbing in nature.  No vision changes, numbness, weakness.  She does report some associated nausea.  No fever or nasal congestion.   Hx/o DM, HTN, breast cancer 2018, stage 1 s/p resection.   No prior similar sxs. Pcp started prednisone Monday for back pain.  Has carbon monoxide detector at home, no concern for exposure.      Home Medications Prior to Admission medications   Medication Sig Start Date End Date Taking? Authorizing Provider  alum & mag hydroxide-simeth (MAALOX MAX) 400-400-40 MG/5ML suspension Take 5 mLs by mouth every 6 (six) hours as needed for indigestion. 02/23/22   Horton, Clabe Seal, DO  amLODipine (NORVASC) 5 MG tablet Take 5 mg by mouth daily. 07/08/16   [provider]  anastrozole (ARIMIDEX) 1 MG tablet TAKE 1 TABLET BY MOUTH EVERY DAY 05/24/20   Magrinat, Valentino Hue, MD  Diclofenac Sodium (PENNSAID) 2 % SOLN Place 1 application onto the skin 2 (two) times daily. 04/23/20   Myra Rude, MD  doxazosin (CARDURA) 2 MG tablet Take 2 mg by mouth daily. 11/15/17   [provider]  gabapentin (NEURONTIN) 300 MG capsule TAKE 1 CAPSULE BY MOUTH THREE TIMES A DAY Please Schedule visit for further refills. 02/22/22   Myra Rude, MD  hydrochlorothiazide (HYDRODIURIL) 25 MG tablet Take 25 mg by mouth daily. 08/26/19    [provider]  HYDROcodone-acetaminophen (NORCO/VICODIN) 5-325 MG tablet Take 1 tablet by mouth every 8 (eight) hours as needed. 03/04/21   Myra Rude, MD  hydrocortisone 2.5 % cream Apply 1 application topically daily as needed (skin redness).    [provider]  ibuprofen (ADVIL) 400 MG tablet Take 1 tablet (400 mg total) by mouth every 8 (eight) hours as needed. 03/04/21   Myra Rude, MD  ketoconazole (NIZORAL) 2 % cream Apply 1 application topically daily as needed for irritation.    [provider]  losartan-hydrochlorothiazide (HYZAAR) 100-25 MG tablet Take 1 tablet by mouth daily.    [provider]  metFORMIN (GLUCOPHAGE) 500 MG tablet Take 1 tablet (500 mg total) by mouth 2 (two) times daily with a meal. 09/17/20   Magrinat, Valentino Hue, MD  pantoprazole (PROTONIX) 20 MG tablet Take 1 tablet (20 mg total) by mouth daily. 02/23/22   Horton, Clabe Seal, DO      Allergies    Patient has no known allergies.    Review of Systems   Review of Systems  Neurological:  Positive for headaches.  All other systems reviewed and are negative.   Physical Exam Updated Vital Signs BP 130/61   Pulse (!) 58   Temp 97.8 F (36.6 C) (Oral)   Resp 18   Ht 5\' 6"  (1.676 m)   Wt (!) 170.6 kg   SpO2 97%   BMI 60.69 kg/m  Physical Exam  Vitals and nursing note reviewed.  Constitutional:      Appearance: She is well-developed.  HENT:     Head: Normocephalic and atraumatic.     Comments: Pupils equal round and reactive, EOMI.  No temporal tenderness to palpation bilaterally Cardiovascular:     Rate and Rhythm: Normal rate and regular rhythm.  Pulmonary:     Effort: Pulmonary effort is normal. No respiratory distress.  Musculoskeletal:        General: No tenderness.  Skin:    General: Skin is warm and dry.  Neurological:     Mental Status: She is alert and oriented to person, place, and time.     Comments: No asymmetry of facial movements, visual  fields grossly intact.  5 out of 5 strength in all 4 extremities  Psychiatric:        Behavior: Behavior normal.     ED Results / Procedures / Treatments   Labs (all labs ordered are listed, but only abnormal results are displayed) Labs Reviewed  CBG MONITORING, ED    EKG None  Radiology CT Head Wo Contrast Result Date: 01/13/2023 CLINICAL DATA:  Headache, new onset since last night. EXAM: CT HEAD WITHOUT CONTRAST TECHNIQUE: Contiguous axial images were obtained from the base of the skull through the vertex without intravenous contrast. RADIATION DOSE REDUCTION: This exam was performed according to the departmental dose-optimization program which includes automated exposure control, adjustment of the mA and/or kV according to patient size and/or use of iterative reconstruction technique. COMPARISON:  None Available. FINDINGS: Brain: No evidence of acute infarction, hemorrhage, hydrocephalus, extra-axial collection or mass lesion/mass effect. Vascular: No hyperdense vessel or unexpected calcification. Skull: Normal. Negative for fracture or focal lesion. Sinuses/Orbits: No acute finding. IMPRESSION: Negative head CT.  No explanation for headache Electronically Signed   By: Tiburcio Pea M.D.   On: 01/13/2023 06:36    Procedures Procedures    Medications Ordered in ED Medications  metoCLOPramide (REGLAN) injection 10 mg (10 mg Intravenous Given 01/13/23 1610)    ED Course/ Medical Decision Making/ A&P                                 Medical Decision Making Amount and/or Complexity of Data Reviewed Radiology: ordered.  Risk Prescription drug management.   Patient with history of diabetes, hypertension, remote history of breast cancer in remission here for evaluation of atraumatic gradual onset headache.  She is nontoxic-appearing on evaluation with no focal neurologic deficits.  Examination is not consistent with acute ocular pathology, temporal arteritis, CVA, subarachnoid  hemorrhage, meningitis.  CT head was obtained, which is negative for acute abnormality.  CT imaging obtained given no significant history of headaches.  She was treated with metoclopramide in the emergency department with improvement in her symptoms.  Discussed with patient unclear source of her symptoms, feel she is stable for outpatient follow-up with OTC medications and return precautions.  Picture is not consistent with dural sinus thrombosis, hypertensive urgency.        Final Clinical Impression(s) / ED Diagnoses Final diagnoses:  Bad headache    Rx / DC Orders ED Discharge Orders     None         Tilden Fossa, MD 01/13/23 [phone]

## 2023-01-13 NOTE — ED Triage Notes (Signed)
Pt with gradual HA that began in forehead. Pt reports when she was at home it got worse when she stood up. No hx migraines. Pt tried to check her CBG at home and did not have strips; pt on Prednisone since Monday for her back and thinks it is her sugar that is high. No vision changes, no weakness, no slurred speech.

## 2023-01-13 NOTE — ED Notes (Signed)
Pt states HA started last evening, states she took Tylenol without relief.  Pain increases when she moves or ambulates

## 2023-12-25 LAB — COLOGUARD: COLOGUARD: NEGATIVE
